# Patient Record
Sex: Female | Born: 1970 | Race: White | Hispanic: No | Smoking: Former smoker
Health system: Southern US, Community
[De-identification: ages and names within clinical notes are randomized; demographics above are authoritative.]

## PROBLEM LIST (undated history)

## (undated) DIAGNOSIS — F419 Anxiety disorder, unspecified: Secondary | ICD-10-CM

## (undated) DIAGNOSIS — Z9289 Personal history of other medical treatment: Secondary | ICD-10-CM

## (undated) DIAGNOSIS — B009 Herpesviral infection, unspecified: Secondary | ICD-10-CM

## (undated) DIAGNOSIS — G43829 Menstrual migraine, not intractable, without status migrainosus: Secondary | ICD-10-CM

## (undated) DIAGNOSIS — I471 Supraventricular tachycardia, unspecified: Secondary | ICD-10-CM

## (undated) DIAGNOSIS — N898 Other specified noninflammatory disorders of vagina: Secondary | ICD-10-CM

## (undated) DIAGNOSIS — F32A Depression, unspecified: Secondary | ICD-10-CM

## (undated) DIAGNOSIS — F329 Major depressive disorder, single episode, unspecified: Secondary | ICD-10-CM

## (undated) HISTORY — PX: KNEE SURGERY: SHX244

## (undated) HISTORY — DX: Personal history of other medical treatment: Z92.89

## (undated) HISTORY — DX: Depression, unspecified: F32.A

## (undated) HISTORY — DX: Major depressive disorder, single episode, unspecified: F32.9

## (undated) HISTORY — DX: Menstrual migraine, not intractable, without status migrainosus: G43.829

## (undated) HISTORY — DX: Herpesviral infection, unspecified: B00.9

## (undated) HISTORY — DX: Other specified noninflammatory disorders of vagina: N89.8

---

## 1997-11-13 ENCOUNTER — Other Ambulatory Visit: Admission: RE | Admit: 1997-11-13 | Discharge: 1997-11-13 | Payer: Self-pay | Admitting: Obstetrics & Gynecology

## 1997-11-18 ENCOUNTER — Other Ambulatory Visit: Admission: RE | Admit: 1997-11-18 | Discharge: 1997-11-18 | Payer: Self-pay | Admitting: Obstetrics & Gynecology

## 1997-11-22 ENCOUNTER — Inpatient Hospital Stay (HOSPITAL_COMMUNITY): Admission: AD | Admit: 1997-11-22 | Discharge: 1997-11-22 | Payer: Self-pay | Admitting: Obstetrics & Gynecology

## 1997-11-24 ENCOUNTER — Inpatient Hospital Stay (HOSPITAL_COMMUNITY): Admission: AD | Admit: 1997-11-24 | Discharge: 1997-11-26 | Payer: Self-pay | Admitting: Obstetrics and Gynecology

## 1998-03-01 ENCOUNTER — Other Ambulatory Visit: Admission: RE | Admit: 1998-03-01 | Discharge: 1998-03-01 | Payer: Self-pay | Admitting: Obstetrics & Gynecology

## 1998-04-13 ENCOUNTER — Ambulatory Visit (HOSPITAL_COMMUNITY): Admission: RE | Admit: 1998-04-13 | Discharge: 1998-04-13 | Payer: Self-pay | Admitting: *Deleted

## 1998-10-21 ENCOUNTER — Other Ambulatory Visit: Admission: RE | Admit: 1998-10-21 | Discharge: 1998-10-21 | Payer: Self-pay | Admitting: Obstetrics & Gynecology

## 1999-05-20 ENCOUNTER — Inpatient Hospital Stay (HOSPITAL_COMMUNITY): Admission: AD | Admit: 1999-05-20 | Discharge: 1999-05-22 | Payer: Self-pay | Admitting: Obstetrics and Gynecology

## 2000-04-05 ENCOUNTER — Other Ambulatory Visit: Admission: RE | Admit: 2000-04-05 | Discharge: 2000-04-05 | Payer: Self-pay | Admitting: Obstetrics and Gynecology

## 2000-11-08 ENCOUNTER — Inpatient Hospital Stay (HOSPITAL_COMMUNITY): Admission: AD | Admit: 2000-11-08 | Discharge: 2000-11-10 | Payer: Self-pay | Admitting: Obstetrics and Gynecology

## 2001-04-18 ENCOUNTER — Other Ambulatory Visit: Admission: RE | Admit: 2001-04-18 | Discharge: 2001-04-18 | Payer: Self-pay | Admitting: Obstetrics and Gynecology

## 2003-02-24 ENCOUNTER — Other Ambulatory Visit: Admission: RE | Admit: 2003-02-24 | Discharge: 2003-02-24 | Payer: Self-pay | Admitting: Obstetrics and Gynecology

## 2004-05-10 ENCOUNTER — Encounter: Admission: RE | Admit: 2004-05-10 | Discharge: 2004-05-10 | Payer: Self-pay | Admitting: Obstetrics and Gynecology

## 2004-06-08 ENCOUNTER — Other Ambulatory Visit: Admission: RE | Admit: 2004-06-08 | Discharge: 2004-06-08 | Payer: Self-pay | Admitting: Obstetrics and Gynecology

## 2005-12-13 ENCOUNTER — Encounter: Payer: Self-pay | Admitting: *Deleted

## 2010-09-21 ENCOUNTER — Ambulatory Visit: Payer: Self-pay | Admitting: General Practice

## 2010-10-17 ENCOUNTER — Encounter: Payer: Self-pay | Admitting: Sports Medicine

## 2010-11-13 ENCOUNTER — Encounter: Payer: Self-pay | Admitting: Sports Medicine

## 2012-06-25 LAB — HM MAMMOGRAPHY

## 2013-07-14 DIAGNOSIS — Z9289 Personal history of other medical treatment: Secondary | ICD-10-CM

## 2013-07-14 HISTORY — DX: Personal history of other medical treatment: Z92.89

## 2014-03-22 ENCOUNTER — Emergency Department: Payer: Self-pay | Admitting: Emergency Medicine

## 2014-03-24 ENCOUNTER — Encounter: Payer: Self-pay | Admitting: General Practice

## 2014-04-14 ENCOUNTER — Encounter: Payer: Self-pay | Admitting: General Practice

## 2015-01-13 DIAGNOSIS — N898 Other specified noninflammatory disorders of vagina: Secondary | ICD-10-CM

## 2015-01-13 DIAGNOSIS — B009 Herpesviral infection, unspecified: Secondary | ICD-10-CM

## 2015-01-13 HISTORY — DX: Other specified noninflammatory disorders of vagina: N89.8

## 2015-01-13 HISTORY — DX: Herpesviral infection, unspecified: B00.9

## 2015-08-02 LAB — HM PAP SMEAR: HM PAP: NEGATIVE

## 2015-09-20 DIAGNOSIS — F329 Major depressive disorder, single episode, unspecified: Secondary | ICD-10-CM | POA: Diagnosis not present

## 2015-09-20 DIAGNOSIS — N938 Other specified abnormal uterine and vaginal bleeding: Secondary | ICD-10-CM | POA: Diagnosis not present

## 2016-01-07 DIAGNOSIS — H524 Presbyopia: Secondary | ICD-10-CM | POA: Diagnosis not present

## 2016-02-10 ENCOUNTER — Emergency Department
Admission: EM | Admit: 2016-02-10 | Discharge: 2016-02-10 | Disposition: A | Discharge status: Home / Self Care | Payer: 59 | Attending: Emergency Medicine | Admitting: Emergency Medicine

## 2016-02-10 DIAGNOSIS — R000 Tachycardia, unspecified: Secondary | ICD-10-CM | POA: Diagnosis not present

## 2016-02-10 DIAGNOSIS — F419 Anxiety disorder, unspecified: Secondary | ICD-10-CM | POA: Insufficient documentation

## 2016-02-10 DIAGNOSIS — Z79899 Other long term (current) drug therapy: Secondary | ICD-10-CM | POA: Diagnosis not present

## 2016-02-10 HISTORY — DX: Anxiety disorder, unspecified: F41.9

## 2016-02-10 LAB — CBC
HEMATOCRIT: 42.3 % (ref 35.0–47.0)
HEMOGLOBIN: 15 g/dL (ref 12.0–16.0)
MCH: 32.4 pg (ref 26.0–34.0)
MCHC: 35.6 g/dL (ref 32.0–36.0)
MCV: 91.2 fL (ref 80.0–100.0)
PLATELETS: 245 10*3/uL (ref 150–440)
RBC: 4.64 MIL/uL (ref 3.80–5.20)
RDW: 12.6 % (ref 11.5–14.5)
WBC: 10.9 10*3/uL (ref 3.6–11.0)

## 2016-02-10 LAB — BASIC METABOLIC PANEL
ANION GAP: 13 (ref 5–15)
BUN: 13 mg/dL (ref 6–20)
CHLORIDE: 100 mmol/L — AB (ref 101–111)
CO2: 24 mmol/L (ref 22–32)
Calcium: 9.3 mg/dL (ref 8.9–10.3)
Creatinine, Ser: 1.05 mg/dL — ABNORMAL HIGH (ref 0.44–1.00)
GFR calc non Af Amer: >60 mL/min (ref >60)
Glucose, Bld: 123 mg/dL — ABNORMAL HIGH (ref 65–99)
POTASSIUM: 4 mmol/L (ref 3.5–5.1)
SODIUM: 137 mmol/L (ref 135–145)

## 2016-02-10 LAB — TROPONIN I: Troponin I: <0.03 ng/mL (ref <0.03)

## 2016-02-10 MED ORDER — SODIUM CHLORIDE 0.9 % IV BOLUS (SEPSIS)
1000.0000 mL | Freq: Once | INTRAVENOUS | Status: AC
  Administered 2016-02-10: 1000 mL via INTRAVENOUS

## 2016-02-10 MED ORDER — LORAZEPAM 2 MG/ML IJ SOLN
INTRAMUSCULAR | Status: AC
  Administered 2016-02-10: 1 mg via INTRAVENOUS
  Filled 2016-02-10: qty 1

## 2016-02-10 MED ORDER — LORAZEPAM 2 MG/ML IJ SOLN
1.0000 mg | Freq: Once | INTRAMUSCULAR | Status: AC
  Administered 2016-02-10: 1 mg via INTRAVENOUS

## 2016-02-10 NOTE — ED Notes (Signed)
Pt at work and began to feel diaphoretic. Pt placed on monitor at HR reading 150. Pt placed on NRB. MD at bedside. Pt denies pain. Pt attempting to calm herself by taking deep breaths. Alert and oriented X4.

## 2016-02-10 NOTE — ED Notes (Signed)
Patient is resting comfortably. 

## 2016-02-10 NOTE — ED Notes (Signed)
Pt denies taking anything PTA. States that she took Excedrin for headache PTA to work. Pt states that she has been experiencing a lot of stress. Pt taken off of oxygen at this time. Pt able to talk in full and complete sentences.

## 2016-02-10 NOTE — ED Provider Notes (Signed)
[facility] Emergency Department Provider Note  Time seen: 3:53 PM  I have reviewed the triage vital signs and the nursing notes.   HISTORY  Chief Complaint Tachycardia    HPI Megan SayreJanet L Hernandez is a 45 y.o. female with no past medical history who presents the emergency department with shaking and diaphoresis. Patient works at the emergency department, she states she just transported to the patient felt lightheaded and became diaphoretic. She checked her pulse with a pulse oximeter, had a pulse rate 150+. States she began having shaking in her arms or legs. She could not control. Patient denies any pain, denies any chest pain, trouble breathing. Patient states a similar episode years ago but she was using energy drinks at that time.She states she has not used any stimulants, does not use energy drinks. She does state she took an Excedrin for headache prior to arriving at work today. The patient states she feels numbness and tingling in both of her hands. Denies any weakness. Denies any headache currently.     Past Medical History  Diagnosis Date  . Anxiety     There are no active problems to display for this patient.   History reviewed. No pertinent past surgical history.  No current outpatient prescriptions on file.  Allergies Review of patient's allergies indicates no known allergies.  No family history on file.  Social History Social History  Substance Use Topics  . Smoking status: Never Smoker   . Smokeless tobacco: None  . Alcohol Use: No    Review of Systems Constitutional: Negative for fever. Cardiovascular: Negative for chest pain. Respiratory: Negative for shortness of breath. Gastrointestinal: Negative for abdominal pain Musculoskeletal: Negative for back pain. Neurological: Negative for headache 10-point ROS otherwise negative.  ____________________________________________   PHYSICAL EXAM:  VITAL SIGNS: ED Triage Vitals  Enc  Vitals Group     BP 02/10/16 1545 129/87 mmHg     Pulse Rate 02/10/16 1543 104     Resp 02/10/16 1543 28     Temp --      Temp src --      SpO2 02/10/16 1543 100 %     Weight --      Height --      Head Cir --      Peak Flow --      Pain Score --      Pain Loc --      Pain Edu? --      Excl. in GC? --     Constitutional: Alert and oriented. Tachypnea, hyperventilating, shaking in her extremities. Eyes: Normal exam, PERRL ENT   Head: Normocephalic and atraumatic.   Mouth/Throat: Mucous membranes are moist. Cardiovascular: Normal rate, regular rhythm. No murmur Respiratory: Normal respiratory effort without tachypnea nor retractions. Breath sounds are clear Gastrointestinal: Soft and nontender. No distention.   Musculoskeletal: Nontender extremities, shaking evident all extremities. Neurologic:  Normal speech and language. No gross focal neurologic deficits. Equal grip strengths. Skin:  Skin is warm, mild/slight diaphoresis. Psychiatric: Does appear anxious.  ____________________________________________    EKG  KG reviewed and interpreted by myself shows normal sinus rhythm at 101 bpm. Narrow QRS, normal axis, normal intervals and nonspecific ST changes. No skin elevations. Computer read as atrial fibrillation however she clearly has P waves before every QRS, electrical interference due to shaking.  ____________________________________________    INITIAL IMPRESSION / ASSESSMENT AND PLAN / ED COURSE  Pertinent labs & imaging results that were available during my care of  the patient were reviewed by me and considered in my medical decision making (see chart for details).  Patient presents to the emergency department with tachypnea, hyperventilation, diaphoresis, shaking in all extremities. Patient did take Excedrin migraine prior to arrival in the emergency department which does contain caffeine. Patient's heart rate initially elevated in the 120s, currently in the  90s. Patient received 1 mg of Ativan as well as IV fluids in the emergency department. After Ativan the patient is feeling much better, shaking has largely resolved, the heart rate has decreased, 81 currently. We will check basic labs and monitor closely in the emergency department.  Labs are within normal limits. Patient is feeling much better continues to have a normal heart rate around 75 bpm. Denies any symptoms at this time. Highly suspect an anxiety reaction. I discussed return precautions.  ____________________________________________   FINAL CLINICAL IMPRESSION(S) / ED DIAGNOSES  Tachycardia  Minna AntisKevin Shenique Childers, MD 02/10/16 [phone]

## 2016-02-10 NOTE — Discharge Instructions (Signed)
Nonspecific Tachycardia Tachycardia is a faster than normal heartbeat (more than 100 beats per minute). In adults, the heart normally beats between 60 and 100 times a minute. A fast heartbeat may be a normal response to exercise or stress. It does not necessarily mean that something is wrong. However, sometimes when your heart beats too fast it may not be able to pump enough blood to the rest of your body. This can result in chest pain, shortness of breath, dizziness, and even fainting. Nonspecific tachycardia means that the specific cause or pattern of your tachycardia is unknown. CAUSES  Tachycardia may be harmless or it may be due to a more serious underlying cause. Possible causes of tachycardia include:  Exercise or exertion.  Fever.  Pain or injury.  Infection.  Loss of body fluids (dehydration).  Overactive thyroid.  Lack of red blood cells (anemia).  Anxiety and stress.  Alcohol.  Caffeine.  Tobacco products.  Diet pills.  Illegal drugs.  Heart disease. SYMPTOMS  Rapid or irregular heartbeat (palpitations).  Suddenly feeling your heart beating (cardiac awareness).  Dizziness.  Tiredness (fatigue).  Shortness of breath.  Chest pain.  Nausea.  Fainting. DIAGNOSIS  Your caregiver will perform a physical exam and take your medical history. In some cases, a heart specialist (cardiologist) may be consulted. Your caregiver may also order:  Blood tests.  Electrocardiography. This test records the electrical activity of your heart.  A heart monitoring test. TREATMENT  Treatment will depend on the likely cause of your tachycardia. The goal is to treat the underlying cause of your tachycardia. Treatment methods may include:  Replacement of fluids or blood through an intravenous (IV) tube for moderate to severe dehydration or anemia.  New medicines or changes in your current medicines.  Diet and lifestyle changes.  Treatment for certain  infections.  Stress relief or relaxation methods. HOME CARE INSTRUCTIONS   Rest.  Drink enough fluids to keep your urine clear or pale yellow.  Do not smoke.  Avoid:  Caffeine.  Tobacco.  Alcohol.  Chocolate.  Stimulants such as over-the-counter diet pills or pills that help you stay awake.  Situations that cause anxiety or stress.  Illegal drugs such as marijuana, phencyclidine (PCP), and cocaine.  Only take medicine as directed by your caregiver.  Keep all follow-up appointments as directed by your caregiver. SEEK IMMEDIATE MEDICAL CARE IF:   You have pain in your chest, upper arms, jaw, or neck.  You become weak, dizzy, or feel faint.  You have palpitations that will not go away.  You vomit, have diarrhea, or pass blood in your stool.  Your skin is cool, pale, and wet.  You have a fever that will not go away with rest, fluids, and medicine. MAKE SURE YOU:   Understand these instructions.  Will watch your condition.  Will get help right away if you are not doing well or get worse.   This information is not intended to replace advice given to you by your health care provider. Make sure you discuss any questions you have with your health care provider.   Document Released: 09/07/2004 Document Revised: 10/23/2011 Document Reviewed: 02/12/2015 Elsevier Interactive Patient Education 2016 Elsevier Inc.  

## 2016-09-19 DIAGNOSIS — Z3041 Encounter for surveillance of contraceptive pills: Secondary | ICD-10-CM | POA: Diagnosis not present

## 2016-09-19 DIAGNOSIS — A6000 Herpesviral infection of urogenital system, unspecified: Secondary | ICD-10-CM | POA: Diagnosis not present

## 2016-09-19 DIAGNOSIS — Z01419 Encounter for gynecological examination (general) (routine) without abnormal findings: Secondary | ICD-10-CM | POA: Diagnosis not present

## 2016-09-19 DIAGNOSIS — Z0000 Encounter for general adult medical examination without abnormal findings: Secondary | ICD-10-CM | POA: Diagnosis not present

## 2016-09-19 DIAGNOSIS — Z1239 Encounter for other screening for malignant neoplasm of breast: Secondary | ICD-10-CM | POA: Diagnosis not present

## 2016-09-19 DIAGNOSIS — Z13220 Encounter for screening for lipoid disorders: Secondary | ICD-10-CM | POA: Diagnosis not present

## 2016-10-11 ENCOUNTER — Other Ambulatory Visit: Payer: Self-pay | Admitting: Advanced Practice Midwife

## 2016-10-11 ENCOUNTER — Encounter: Payer: Self-pay | Admitting: Advanced Practice Midwife

## 2016-10-11 DIAGNOSIS — N938 Other specified abnormal uterine and vaginal bleeding: Secondary | ICD-10-CM

## 2016-10-17 ENCOUNTER — Other Ambulatory Visit: Payer: Self-pay | Admitting: Obstetrics and Gynecology

## 2016-10-17 ENCOUNTER — Telehealth: Payer: Self-pay | Admitting: Obstetrics and Gynecology

## 2016-10-17 ENCOUNTER — Ambulatory Visit (INDEPENDENT_AMBULATORY_CARE_PROVIDER_SITE_OTHER): Payer: 59

## 2016-10-17 DIAGNOSIS — N938 Other specified abnormal uterine and vaginal bleeding: Secondary | ICD-10-CM | POA: Diagnosis not present

## 2016-10-17 MED ORDER — SUMATRIPTAN SUCCINATE 100 MG PO TABS: 100.0000 mg | ORAL_TABLET | Freq: Once | ORAL | 0 refills | Status: DC | PRN

## 2016-10-17 NOTE — Progress Notes (Signed)
tr

## 2016-10-17 NOTE — Telephone Encounter (Signed)
Pt aware of neg u/s for DUB sx on OCPs. Discussed changing OCPs again, IUD, ablation, nuvaring. Pt interested in Mirena. Will check ins coverage. Supposed to have menses this wk on OCPs. RTO 10/19/16 for IUD insertion. Pt can cancel appt if changes mind/no insurance co/not bleeding.  Will also RF imitrex Rx. Pt to follow sx. If migraines cont, will refer to neuro.

## 2016-10-17 NOTE — Telephone Encounter (Signed)
U/S results from 10/17/16: Em=4.7 mm; bilat ovaries with follicles; no FF in CDS. Pt aware.

## 2016-10-18 NOTE — Telephone Encounter (Signed)
Pt scheduled  

## 2016-10-19 ENCOUNTER — Ambulatory Visit: Payer: 59 | Admitting: Obstetrics and Gynecology

## 2016-10-19 NOTE — Telephone Encounter (Signed)
Mirena Stock reserved for this patient. 

## 2016-10-24 ENCOUNTER — Encounter: Payer: Self-pay | Admitting: Physician Assistant

## 2016-10-24 ENCOUNTER — Ambulatory Visit: Payer: Self-pay | Admitting: Physician Assistant

## 2016-10-24 VITALS — BP 100/70 | HR 99 | Temp 98.3°F

## 2016-10-24 DIAGNOSIS — G43109 Migraine with aura, not intractable, without status migrainosus: Secondary | ICD-10-CM

## 2016-10-24 MED ORDER — DOXYCYCLINE HYCLATE 100 MG PO TABS: 100.0000 mg | ORAL_TABLET | Freq: Two times a day (BID) | ORAL | 0 refills | Status: DC

## 2016-10-24 MED ORDER — RIZATRIPTAN BENZOATE 10 MG PO TABS: 10.0000 mg | ORAL_TABLET | ORAL | 6 refills | Status: DC | PRN

## 2016-10-24 MED ORDER — PREDNISONE 10 MG PO TABS: 30.0000 mg | ORAL_TABLET | Freq: Every day | ORAL | 0 refills | Status: DC

## 2016-10-24 NOTE — Progress Notes (Addendum)
S: c/o headaches for over a year, have been worsening in intensity over last few months, pain will start at eye with decreased vision and loss of peripheral vision, headache will go across her forehead, no v/d, will be sensitive to light, last headache lasted for 5 days even with imitrex on board, insurance won't pay but for 9 pills a month, no trauma, no loc, states she thinks her hormones and stress level are contributing factors  O:v itals wnl, nad, perrl eomi, lungs c t a, cv rrr, cn II-xii intact  A: migraines,   P: maxalt, refer to neurology

## 2016-10-25 NOTE — Addendum Note (Signed)
Addended by: Yvonne KendallBROWN, Zak Gondek D on: 10/25/2016 10:26 AM   Modules accepted: Orders

## 2016-10-26 NOTE — Progress Notes (Signed)
[facility] sent over  On 10/25/2016. Awaiting appt.

## 2016-11-09 ENCOUNTER — Telehealth: Payer: Self-pay | Admitting: Obstetrics and Gynecology

## 2016-11-09 MED ORDER — VELIVET 0.1/0.125/0.15 -0.025 MG PO TABS: 1.0000 | ORAL_TABLET | Freq: Every day | ORAL | 1 refills | Status: DC

## 2016-11-09 NOTE — Telephone Encounter (Signed)
Patient called and scheduled her IUD insertion on May 1st with Alicia.  She will need another pk of pills to last until then.  Please advise  [phone] [facility]

## 2016-11-09 NOTE — Telephone Encounter (Signed)
Done

## 2016-11-09 NOTE — Telephone Encounter (Signed)
Noted  

## 2016-11-28 ENCOUNTER — Encounter: Payer: Self-pay | Admitting: Neurology

## 2016-11-29 ENCOUNTER — Ambulatory Visit: Payer: 59 | Admitting: Neurology

## 2016-12-12 ENCOUNTER — Ambulatory Visit: Payer: 59 | Admitting: Obstetrics and Gynecology

## 2016-12-12 ENCOUNTER — Telehealth: Payer: Self-pay | Admitting: Obstetrics and Gynecology

## 2016-12-12 ENCOUNTER — Encounter: Payer: Self-pay | Admitting: Obstetrics and Gynecology

## 2016-12-12 NOTE — Telephone Encounter (Signed)
Pt cancelled IUD insertion for 5/1

## 2016-12-12 NOTE — Telephone Encounter (Signed)
Pt needs to call when menses starts and I'll work her in for insertion. Huntley Dec, pls notify pt of this. Thx.

## 2016-12-12 NOTE — Telephone Encounter (Signed)
Pt is calling to cancel appt due to her cycle not coming on. Pt states she has cancelled twice now and would like to be advise on how to schedule due to her cycle being very irregular.

## 2016-12-12 NOTE — Telephone Encounter (Signed)
Mirena stock reserved for this patient. 

## 2016-12-13 NOTE — Telephone Encounter (Signed)
Pt advised.

## 2016-12-13 NOTE — Telephone Encounter (Signed)
Per Helmut Muster to work in pt when cycle comes on.

## 2016-12-17 ENCOUNTER — Other Ambulatory Visit: Payer: Self-pay | Admitting: Obstetrics and Gynecology

## 2016-12-18 ENCOUNTER — Other Ambulatory Visit: Payer: Self-pay | Admitting: Obstetrics and Gynecology

## 2016-12-18 MED ORDER — SUMATRIPTAN SUCCINATE 100 MG PO TABS: 100.0000 mg | ORAL_TABLET | Freq: Once | ORAL | 3 refills | Status: DC | PRN

## 2017-01-09 ENCOUNTER — Encounter: Payer: Self-pay | Admitting: Obstetrics and Gynecology

## 2017-01-09 NOTE — Telephone Encounter (Signed)
Pt is schedule for Mirena insertion with Helmut Musterlicia Copland 01/11/17

## 2017-01-10 NOTE — Telephone Encounter (Signed)
This encounter was created in error - please disregard.

## 2017-01-10 NOTE — Telephone Encounter (Signed)
Noted. Mirena stock reserved for this patient. 

## 2017-01-10 NOTE — Telephone Encounter (Signed)
You 1 hour ago (12:27 PM)     Noted. Mirena stock reserved for this patient.      Documentation     Paschal, Sung AmabileSara E routed conversation to You 01/09/17 (12:37 PM)    Tresa MoorePaschal, Sara E 01/09/17 (12:37 PM)      Pt is schedule for Mirena insertion with Helmut Musterlicia Copland 01/11/17

## 2017-01-11 ENCOUNTER — Encounter: Payer: Self-pay | Admitting: Obstetrics and Gynecology

## 2017-01-11 ENCOUNTER — Ambulatory Visit (INDEPENDENT_AMBULATORY_CARE_PROVIDER_SITE_OTHER): Payer: 59 | Admitting: Obstetrics and Gynecology

## 2017-01-11 VITALS — BP 116/64 | HR 86 | Wt 146.0 lb

## 2017-01-11 DIAGNOSIS — Z30430 Encounter for insertion of intrauterine contraceptive device: Secondary | ICD-10-CM

## 2017-01-11 MED ORDER — LEVONORGESTREL 20 MCG/24HR IU IUD: 1.0000 | INTRAUTERINE_SYSTEM | Freq: Once | INTRAUTERINE | 0 refills | Status: AC

## 2017-01-11 NOTE — Patient Instructions (Signed)

## 2017-01-11 NOTE — Telephone Encounter (Signed)
Pt arrived for IUD placement

## 2017-01-11 NOTE — Progress Notes (Signed)
   Chief Complaint  Patient presents with  . Contraception    Mirena insert     IUD PROCEDURE NOTE:  Megan SayreJanet L Petrosky is a 46 y.o. [id number] here for Mirena  IUD insertion. She had BTB on several OCPs with neg labs/u/s 4/18. Pt [facility] and cycle control.    BP 116/64 (BP Location: Left Arm, Patient Position: Sitting, Cuff Size: Normal)   Pulse 86   Wt 146 lb (66.2 kg)   LMP 01/09/2017 (Exact Date)   BMI 24.30 kg/m   IUD Insertion Procedure Note Patient identified, informed consent performed, consent signed.   Discussed risks of irregular bleeding, cramping, infection, malpositioning or misplacement of the IUD outside the uterus which may require further procedure such as laparoscopy, risk of failure <1%. Time out was performed.    A bimanual exam showed the uterus to be anteverted.  Speculum placed in the vagina.  Cervix visualized.  Cleaned with Betadine x 2.  Grasped anteriorly with a single tooth tenaculum.  Uterus sounded to 7.0 cm.   IUD placed per manufacturer's recommendations.  Strings trimmed to 3 cm. Tenaculum was removed, good hemostasis noted.  Patient tolerated procedure well.   ASSESSMENT: IUD insertion Encounter for insertion of intrauterine contraceptive device (IUD) - IUD in place. RTO in 4 wks for IUD check. - Plan: levonorgestrel (MIRENA, 52 MG,) 20 MCG/24HR IUD   Plan:  Patient was given post-procedure instructions.  She was advised to have backup contraception for one week.   Call if you are having increasing pain, cramps or bleeding or if you have a fever greater than 100.4 degrees F., shaking chills, nausea or vomiting. Patient was also asked to check IUD strings periodically and follow up in 4 weeks for IUD check.  Return in about 4 weeks (around 02/08/2017) for IUD f/u.  Latrisha Coiro B. Candee Hoon, PA-C 01/11/2017 3:03 PM

## 2017-02-06 ENCOUNTER — Encounter: Payer: Self-pay | Admitting: Obstetrics and Gynecology

## 2017-02-07 ENCOUNTER — Ambulatory Visit: Payer: 59 | Admitting: Obstetrics and Gynecology

## 2017-05-23 DIAGNOSIS — H524 Presbyopia: Secondary | ICD-10-CM | POA: Diagnosis not present

## 2017-06-11 ENCOUNTER — Other Ambulatory Visit: Payer: Self-pay | Admitting: Obstetrics and Gynecology

## 2017-06-11 NOTE — Telephone Encounter (Signed)
Please advise 

## 2017-09-27 ENCOUNTER — Ambulatory Visit (INDEPENDENT_AMBULATORY_CARE_PROVIDER_SITE_OTHER): Payer: Self-pay | Admitting: Nurse Practitioner

## 2017-09-27 VITALS — BP 98/57 | HR 81 | Temp 98.7°F | Wt 140.0 lb

## 2017-09-27 DIAGNOSIS — G43909 Migraine, unspecified, not intractable, without status migrainosus: Secondary | ICD-10-CM

## 2017-09-27 MED ORDER — SUMATRIPTAN SUCCINATE 100 MG PO TABS: 100.0000 mg | ORAL_TABLET | ORAL | 0 refills | Status: DC | PRN

## 2017-09-27 MED ORDER — RIZATRIPTAN BENZOATE 10 MG PO TABS: 10.0000 mg | ORAL_TABLET | ORAL | 0 refills | Status: DC | PRN

## 2017-09-27 NOTE — Patient Instructions (Addendum)
Migraine Headache A migraine headache is an intense, throbbing pain on one side or both sides of the head. Migraines may also cause other symptoms, such as nausea, vomiting, and sensitivity to light and noise. What are the causes? Doing or taking certain things may also trigger migraines, such as:  Alcohol.  Smoking.  Medicines, such as: ? Medicine used to treat chest pain (nitroglycerine). ? Birth control pills. ? Estrogen pills. ? Certain blood pressure medicines.  Aged cheeses, chocolate, or caffeine.  Foods or drinks that contain nitrates, glutamate, aspartame, or tyramine.  Physical activity.  Other things that may trigger a migraine include:  Menstruation.  Pregnancy.  Hunger.  Stress, lack of sleep, too much sleep, or fatigue.  Weather changes.  What increases the risk? The following factors may make you more likely to experience migraine headaches:  Age. Risk increases with age.  Family history of migraine headaches.  Being Caucasian.  Depression and anxiety.  Obesity.  Being a woman.  Having a hole in the heart (patent foramen ovale) or other heart problems.  What are the signs or symptoms? The main symptom of this condition is pulsating or throbbing pain. Pain may:  Happen in any area of the head, such as on one side or both sides.  Interfere with daily activities.  Get worse with physical activity.  Get worse with exposure to bright lights or loud noises.  Other symptoms may include:  Nausea.  Vomiting.  Dizziness.  General sensitivity to bright lights, loud noises, or smells.  Before you get a migraine, you may get warning signs that a migraine is developing (aura). An aura may include:  Seeing flashing lights or having blind spots.  Seeing bright spots, halos, or zigzag lines.  Having tunnel vision or blurred vision.  Having numbness or a tingling feeling.  Having trouble talking.  Having muscle weakness.  How is this  diagnosed? A migraine headache can be diagnosed based on:  Your symptoms.  A physical exam.  Tests, such as CT scan or MRI of the head. These imaging tests can help rule out other causes of headaches.  Taking fluid from the spine (lumbar puncture) and analyzing it (cerebrospinal fluid analysis, or CSF analysis).  How is this treated? A migraine headache is usually treated with medicines that:  Relieve pain.  Relieve nausea.  Prevent migraines from coming back.  Treatment may also include:  Acupuncture.  Lifestyle changes like avoiding foods that trigger migraines.  Follow these instructions at home: Medicines  Take over-the-counter and prescription medicines only as told by your health care provider.  Do not drive or use heavy machinery while taking prescription pain medicine.  To prevent or treat constipation while you are taking prescription pain medicine, your health care provider may recommend that you: ? Drink enough fluid to keep your urine clear or pale yellow. ? Take over-the-counter or prescription medicines. ? Eat foods that are high in fiber, such as fresh fruits and vegetables, whole grains, and beans. ? Limit foods that are high in fat and processed sugars, such as fried and sweet foods. Lifestyle  Avoid alcohol use.  Do not use any products that contain nicotine or tobacco, such as cigarettes and e-cigarettes. If you need help quitting, ask your health care provider.  Get at least 8 hours of sleep every night.  Limit your stress. General instructions   Keep a journal to find out what may trigger your migraine headaches. For example, write down: ? What you eat and   drink. ? How much sleep you get. ? Any change to your diet or medicines.  If you have a migraine: ? Avoid things that make your symptoms worse, such as bright lights. ? It may help to lie down in a dark, quiet room. ? Do not drive or use heavy machinery. ? Ask your health care provider  what activities are safe for you while you are experiencing symptoms.  Keep all follow-up visits as told by your health care provider. This is important. Contact a health care provider if:  You develop symptoms that are different or more severe than your usual migraine symptoms. Get help right away if:  Your migraine becomes severe.  You have a fever.  You have a stiff neck.  You have vision loss.  Your muscles feel weak or like you cannot control them.  You start to lose your balance often.  You develop trouble walking.  You faint. This information is not intended to replace advice given to you by your health care provider. Make sure you discuss any questions you have with your health care provider. Document Released: 07/31/2005 Document Revised: 02/18/2016 Document Reviewed: 01/17/2016 Elsevier Interactive Patient Education  2017 Elsevier Inc.   

## 2017-09-27 NOTE — Progress Notes (Signed)
Subjective:    Megan SayreJanet L Hernandez is a 47 y.o. female who presents for evaluation of headache. Symptoms began about a few hours ago. Generally, the headaches last about several hours and occur at rest. The headaches do not seem to be related to any time of the day. The headaches are usually moderate and are located in frontal region.  The patient rates her most severe headaches a 7 on a scale from 1 to 10. Recently, the headaches have been stable. Work attendance or other daily activities are affected by the headaches. Precipitating factors include: none which have been determined. The headaches are usually not preceded by an aura. Associated neurologic symptoms: nausea, photophobia, sensitivity to light. The patient denies dizziness, loss of balance, muscle weakness, numbness of extremities, speech difficulties and vision problems. Home treatment has included nothing with no improvement. Other history includes: migraine headaches diagnosed in the past. Family history includes no known family members with significant headaches.  The following portions of the patient's history were reviewed and updated as appropriate: allergies, current medications and past medical history.  Review of Systems Constitutional: positive for fatigue, negative for anorexia, chills, fevers and malaise Eyes: negative Ears, nose, mouth, throat, and face: negative Respiratory: negative Cardiovascular: negative Gastrointestinal: positive for nausea, negative for abdominal pain, constipation, diarrhea and vomiting Neurological: positive for headaches, negative for coordination problems, dizziness, memory problems, paresthesia, vertigo and weakness    Objective:    BP (!) 98/57   Pulse 81   Temp 98.7 F (37.1 C)   Wt 140 lb (63.5 kg)   SpO2 98%   BMI 23.30 kg/m  General appearance: alert, cooperative, fatigued, no distress and due to HA pain Head: Normocephalic, without obvious abnormality, atraumatic Eyes:  conjunctivae/corneas clear. PERRL, EOM's intact. Fundi benign. Ears: normal TM's and external ear canals both ears Nose: Nares normal. Septum midline. Mucosa normal. No drainage or sinus tenderness. Throat: lips, mucosa, and tongue normal; teeth and gums normal Lungs: clear to auscultation bilaterally Heart: regular rate and rhythm, S1, S2 normal, no murmur, click, rub or gallop Abdomen: soft, non-tender; bowel sounds normal; no masses,  no organomegaly Pulses: 2+ and symmetric Skin: Skin color, texture, turgor normal. No rashes or lesions Neurologic: Grossly normal    Assessment:    Classic migraine    Plan:    Lie in darkened room and apply cold packs as needed for pain. Prophylactic therapy: triptan therapy and and Maxalt due to high frequency of pain. Side effect profile discussed in detail. Asked to keep headache diary. Avoid alcohol, aspirin and OTC NSAIDs, caffeine, excessive physical activity, smoking and stressful situations as much as possible. Patient reassured that neurodiagnostic workup not indicated from benign H&P.

## 2017-10-19 ENCOUNTER — Other Ambulatory Visit: Payer: Self-pay | Admitting: Obstetrics and Gynecology

## 2017-11-21 ENCOUNTER — Other Ambulatory Visit: Payer: Self-pay | Admitting: Nurse Practitioner

## 2017-11-21 ENCOUNTER — Encounter: Payer: Self-pay | Admitting: Obstetrics and Gynecology

## 2017-11-21 ENCOUNTER — Other Ambulatory Visit: Payer: Self-pay | Admitting: Obstetrics and Gynecology

## 2017-11-21 MED ORDER — SERTRALINE HCL 100 MG PO TABS: 100.0000 mg | ORAL_TABLET | Freq: Every day | ORAL | 0 refills | Status: DC

## 2017-11-21 NOTE — Progress Notes (Signed)
Rx RF till 6/19 annual.

## 2018-01-06 ENCOUNTER — Emergency Department
Admission: EM | Admit: 2018-01-06 | Discharge: 2018-01-06 | Disposition: A | Discharge status: Home / Self Care | Payer: 59 | Attending: Emergency Medicine | Admitting: Emergency Medicine

## 2018-01-06 ENCOUNTER — Encounter: Payer: Self-pay | Admitting: Emergency Medicine

## 2018-01-06 ENCOUNTER — Emergency Department: Payer: 59

## 2018-01-06 DIAGNOSIS — R000 Tachycardia, unspecified: Secondary | ICD-10-CM | POA: Diagnosis not present

## 2018-01-06 DIAGNOSIS — Z79899 Other long term (current) drug therapy: Secondary | ICD-10-CM | POA: Diagnosis not present

## 2018-01-06 DIAGNOSIS — R079 Chest pain, unspecified: Secondary | ICD-10-CM | POA: Diagnosis not present

## 2018-01-06 DIAGNOSIS — R0602 Shortness of breath: Secondary | ICD-10-CM | POA: Insufficient documentation

## 2018-01-06 DIAGNOSIS — R52 Pain, unspecified: Secondary | ICD-10-CM

## 2018-01-06 DIAGNOSIS — G43009 Migraine without aura, not intractable, without status migrainosus: Secondary | ICD-10-CM | POA: Insufficient documentation

## 2018-01-06 HISTORY — DX: Supraventricular tachycardia: I47.1

## 2018-01-06 HISTORY — DX: Supraventricular tachycardia, unspecified: I47.10

## 2018-01-06 LAB — BASIC METABOLIC PANEL
ANION GAP: 14 (ref 5–15)
BUN: 21 mg/dL — ABNORMAL HIGH (ref 6–20)
CHLORIDE: 104 mmol/L (ref 101–111)
CO2: 19 mmol/L — ABNORMAL LOW (ref 22–32)
Calcium: 8.9 mg/dL (ref 8.9–10.3)
Creatinine, Ser: 0.93 mg/dL (ref 0.44–1.00)
GFR calc Af Amer: >60 mL/min (ref >60)
GFR calc non Af Amer: >60 mL/min (ref >60)
Glucose, Bld: 113 mg/dL — ABNORMAL HIGH (ref 65–99)
POTASSIUM: 3.7 mmol/L (ref 3.5–5.1)
Sodium: 137 mmol/L (ref 135–145)

## 2018-01-06 LAB — CBC
HEMATOCRIT: 41.3 % (ref 35.0–47.0)
HEMOGLOBIN: 14.8 g/dL (ref 12.0–16.0)
MCH: 32.5 pg (ref 26.0–34.0)
MCHC: 35.7 g/dL (ref 32.0–36.0)
MCV: 91.1 fL (ref 80.0–100.0)
Platelets: 296 10*3/uL (ref 150–440)
RBC: 4.53 MIL/uL (ref 3.80–5.20)
RDW: 13 % (ref 11.5–14.5)
WBC: 10.5 10*3/uL (ref 3.6–11.0)

## 2018-01-06 LAB — TROPONIN I: Troponin I: <0.03 ng/mL (ref <0.03)

## 2018-01-06 LAB — FIBRIN DERIVATIVES D-DIMER (ARMC ONLY): FIBRIN DERIVATIVES D-DIMER (ARMC): 235.07 ng{FEU}/mL (ref 0.00–499.00)

## 2018-01-06 MED ORDER — ADENOSINE 12 MG/4ML IV SOLN
INTRAVENOUS | Status: AC
  Filled 2018-01-06: qty 4

## 2018-01-06 MED ORDER — ADENOSINE 6 MG/2ML IV SOLN
INTRAVENOUS | Status: AC
  Filled 2018-01-06: qty 2

## 2018-01-06 MED ORDER — LORAZEPAM 2 MG/ML IJ SOLN
INTRAMUSCULAR | Status: AC
  Administered 2018-01-06: 0.5 mg via INTRAVENOUS
  Filled 2018-01-06: qty 1

## 2018-01-06 MED ORDER — PROCHLORPERAZINE EDISYLATE 10 MG/2ML IJ SOLN
10.0000 mg | Freq: Once | INTRAMUSCULAR | Status: AC
  Administered 2018-01-06: 10 mg via INTRAVENOUS
  Filled 2018-01-06: qty 2

## 2018-01-06 MED ORDER — LORAZEPAM 2 MG/ML IJ SOLN
0.5000 mg | Freq: Once | INTRAMUSCULAR | Status: AC
  Administered 2018-01-06: 0.5 mg via INTRAVENOUS

## 2018-01-06 MED ORDER — SODIUM CHLORIDE 0.9 % IV BOLUS
1000.0000 mL | Freq: Once | INTRAVENOUS | Status: AC
  Administered 2018-01-06: 1000 mL via INTRAVENOUS

## 2018-01-06 MED ORDER — LORAZEPAM 2 MG/ML IJ SOLN
INTRAMUSCULAR | Status: AC
  Filled 2018-01-06: qty 1

## 2018-01-06 NOTE — ED Triage Notes (Signed)
Patient c/o muscle cramps and tachycardia. Patient's radial pulse in the 140-150 range. Patient reports hx of SVT.

## 2018-01-06 NOTE — ED Notes (Signed)
Pt converted with vagal maneuver. Adenosine returned to pyxis. Ns started.

## 2018-01-06 NOTE — Discharge Instructions (Signed)
Please return for any further problems. I would not work today I have given you enough medicine that you will not be effective. Please follow-up with your regular doctor later on this coming week.

## 2018-01-06 NOTE — ED Provider Notes (Signed)
[facility] Emergency Department Provider Note   ____________________________________________   First MD Initiated Contact with Patient 01/06/18 1916     (approximate)  I have reviewed the triage vital signs and the nursing notes.   HISTORY  Chief Complaint Tachycardia    HPI Megan Hernandez is a 47 y.o. female She reports she was coming to work and got a funny feeling and became short of breath and tachycardic once. On arrival in the emergency room and heart rate was 147. Patient reported history of SVT. She denies any caffeine use except for one cup of coffee earlier this morning. She does not use any supplements does not drink any alcohol.she had one prior visit in June 2017 with tachycardia which was felt to be due to anxiety. Here when I saw the patient heart rate did drop to 123 at that time and there are P waves before each QRS. With a little bit of coaching her heart rate dropped into the 100s and then into the 90s. The time that I saw her she was in sinus tachycardia.   Past Medical History:  Diagnosis Date  . Anxiety   . Depression   . 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 History of mammogram 11/12/213   birads 2 - benign  . History of Papanicolaou smear of cervix 07/14/2013   -/-  . SVT (supraventricular tachycardia) (HCC)   . Vaginal lesion 02/01/2015    There are no active problems to display for this patient.   Past Surgical History:  Procedure Laterality Date  . KNEE SURGERY  as a teen   As a teen    Prior to Admission medications   Medication Sig Start Date End Date Taking? Authorizing Provider  levonorgestrel (MIRENA, 52 MG,) 20 MCG/24HR IUD 1 Intra Uterine Device (1 each total) by Intrauterine route once. 01/11/17 01/11/17  Copland, Ilona Sorrel, PA-C  rizatriptan (MAXALT) 10 MG tablet Take 1 tablet (10 mg total) by mouth as needed for migraine. May repeat in 2 hours if needed Patient not taking: Reported on 09/27/2017 10/24/16   Faythe Ghee, PA-C  rizatriptan (MAXALT) 10 MG tablet Take 1 tablet (10 mg total) by mouth as needed for up to 2 days for migraine. May repeat in 2 hours if needed 09/27/17 09/29/17  Benay Pike, NP  sertraline (ZOLOFT) 100 MG tablet Take 1 tablet (100 mg total) by mouth daily. 11/21/17   Copland, Ilona Sorrel, PA-C  SUMAtriptan (IMITREX) 100 MG tablet Take 1 tablet (100 mg total) by mouth once as needed for migraine. May repeat in 2 hours if headache persists or recurs. Patient not taking: Reported on 09/27/2017 12/18/16   Vena Austria, MD  SUMAtriptan (IMITREX) 100 MG tablet Take 1 tablet (100 mg total) by mouth every 2 (two) hours as needed for up to 2 days for migraine. May repeat in 2 hours if headache persists or recurs. 09/27/17 09/29/17  Benay Pike, NP  valACYclovir (VALTREX) 500 MG tablet  10/23/16   [provider]    Allergies Patient has no known allergies.  Family History  Problem Relation Age of Onset  . Osteoporosis Mother   . Diabetes Father        Type 2  . Hypertension Father   . Depression Father   . Chronic bronchitis Father        copd  . Bipolar disorder Father     Social History Social History   Tobacco  Use  . Smoking status: Never Smoker  . Smokeless tobacco: Never Used  Substance Use Topics  . Alcohol use: Yes    Comment: occ  . Drug use: No    Review of Systems  Constitutional: No fever/chills Eyes: No visual changes. ENT: No sore throat. Cardiovascular: Denies chest pain. Respiratory: Denies shortness of breath. Gastrointestinal: No abdominal pain.  No nausea, no vomiting.  No diarrhea.  No constipation. Genitourinary: Negative for dysuria. Musculoskeletal: Negative for back pain. Skin: Negative for rash. Neurological: Negative for headaches, focal weakness or  ____________________________________________   PHYSICAL EXAM:  VITAL SIGNS: ED Triage Vitals  Enc Vitals Group     BP 01/06/18 1913 117/82     Pulse Rate  01/06/18 1910 (!) 147     Resp 01/06/18 1910 (!) 25     Temp 01/06/18 1913 98.9 F (37.2 C)     Temp Source 01/06/18 1913 Oral     SpO2 01/06/18 1910 100 %     Weight 01/06/18 1912 140 lb (63.5 kg)     Height --      Head Circumference --      Peak Flow --      Pain Score 01/06/18 1911 0     Pain Loc --      Pain Edu? --      Excl. in GC? --     Constitutional: Alert and oriented. Well appearing and in no acute distress. Eyes: Conjunctivae are normal.  Head: Atraumatic. Nose: No congestion/rhinnorhea. Mouth/Throat: Mucous membranes are moist.  Oropharynx non-erythematous. Neck: No stridor Cardiovascular: rapid rate, regular rhythm. Grossly normal heart sounds.  Good peripheral circulation. Respiratory: Normal respiratory effort.  No retractions. Lungs CTAB. Gastrointestinal: Soft and nontender. No distention. No abdominal bruits. No CVA tenderness. Musculoskeletal: No lower extremity tenderness nor edema.   Neurologic:  Normal speech and language. No gross focal neurologic deficits are appreciated. . Skin:  Skin is warm, dry and intact. No rash noted. Psychiatric: Mood and affect are normal. Speech and behavior are normal.  ____________________________________________   LABS (all labs ordered are listed, but only abnormal results are displayed)  Labs Reviewed  BASIC METABOLIC PANEL - Abnormal; Notable for the following components:      Result Value   CO2 19 (*)    Glucose, Bld 113 (*)    BUN 21 (*)    All other components within normal limits  CBC  TROPONIN I  FIBRIN DERIVATIVES D-DIMER ([facility] ONLY)  TROPONIN I   ____________________________________________  EKG  EKG shows sinus tachycardia at a rate of 148 normal axis no acute ST-T changes ____________________________________________  RADIOLOGY  ED MD interpretation: chest x-ray shows no acute disease I reviewed the film  Official radiology report(s): Dg Chest 1 View  Result Date: 01/06/2018 CLINICAL DATA:   Pain EXAM: CHEST  1 VIEW COMPARISON:  None. FINDINGS: The heart size and mediastinal contours are within normal limits. Both lungs are clear. The visualized skeletal structures are unremarkable. IMPRESSION: No active disease. Electronically Signed   By: Elige Ko   On: 01/06/2018 20:05    ____________________________________________   PROCEDURES  Procedure(s) performed:   Procedures  Critical Care performed:   ____________________________________________   INITIAL IMPRESSION / ASSESSMENT AND PLAN / ED COURSE  patient doing much better now she had one other episode of sinus tach that improved with a half milligram of Ativan. Then her migraine flared up a little but not seems to resolve with Compazine. I will let her go  she will follow-up with her regular doctor return if she has any further problems she does not need to work today.         ____________________________________________   FINAL CLINICAL IMPRESSION(S) / ED DIAGNOSES  Final diagnoses:  Tachycardia  Migraine without aura and without status migrainosus, not intractable     ED Discharge Orders    None       Note:  This document was prepared using Dragon voice recognition software and may include unintentional dictation errors.    Arnaldo Natal, MD 01/06/18 2213

## 2018-04-30 ENCOUNTER — Encounter: Payer: Self-pay | Admitting: Nurse Practitioner

## 2018-05-01 ENCOUNTER — Telehealth: Payer: Self-pay | Admitting: Obstetrics and Gynecology

## 2018-05-01 ENCOUNTER — Other Ambulatory Visit: Payer: Self-pay | Admitting: Obstetrics and Gynecology

## 2018-05-01 ENCOUNTER — Encounter: Payer: Self-pay | Admitting: Obstetrics and Gynecology

## 2018-05-01 MED ORDER — SERTRALINE HCL 100 MG PO TABS: 100.0000 mg | ORAL_TABLET | Freq: Every day | ORAL | 0 refills | Status: DC

## 2018-05-01 NOTE — Telephone Encounter (Signed)
Patient is schedule for annual with [facility]. Requesting medication refill on Zoloft for get her to schedule appointment 08/05/18.

## 2018-05-01 NOTE — Telephone Encounter (Signed)
Please advise 

## 2018-05-01 NOTE — Progress Notes (Signed)
Rx RF till 12/19 annual

## 2018-08-05 ENCOUNTER — Encounter: Payer: Self-pay | Admitting: Obstetrics and Gynecology

## 2018-08-05 ENCOUNTER — Ambulatory Visit (INDEPENDENT_AMBULATORY_CARE_PROVIDER_SITE_OTHER): Payer: 59 | Admitting: Obstetrics and Gynecology

## 2018-08-05 ENCOUNTER — Other Ambulatory Visit (HOSPITAL_COMMUNITY)
Admission: RE | Admit: 2018-08-05 | Discharge: 2018-08-05 | Disposition: A | Discharge status: Home / Self Care | Payer: 59 | Source: Ambulatory Visit | Attending: Obstetrics and Gynecology | Admitting: Obstetrics and Gynecology

## 2018-08-05 VITALS — BP 100/64 | HR 80 | Ht 65.0 in | Wt 153.0 lb

## 2018-08-05 DIAGNOSIS — Z1151 Encounter for screening for human papillomavirus (HPV): Secondary | ICD-10-CM

## 2018-08-05 DIAGNOSIS — F329 Major depressive disorder, single episode, unspecified: Secondary | ICD-10-CM

## 2018-08-05 DIAGNOSIS — Z0000 Encounter for general adult medical examination without abnormal findings: Secondary | ICD-10-CM

## 2018-08-05 DIAGNOSIS — F32A Depression, unspecified: Secondary | ICD-10-CM

## 2018-08-05 DIAGNOSIS — A6004 Herpesviral vulvovaginitis: Secondary | ICD-10-CM

## 2018-08-05 DIAGNOSIS — Z13220 Encounter for screening for lipoid disorders: Secondary | ICD-10-CM

## 2018-08-05 DIAGNOSIS — G43839 Menstrual migraine, intractable, without status migrainosus: Secondary | ICD-10-CM

## 2018-08-05 DIAGNOSIS — Z1239 Encounter for other screening for malignant neoplasm of breast: Secondary | ICD-10-CM

## 2018-08-05 DIAGNOSIS — Z01419 Encounter for gynecological examination (general) (routine) without abnormal findings: Secondary | ICD-10-CM

## 2018-08-05 DIAGNOSIS — F419 Anxiety disorder, unspecified: Secondary | ICD-10-CM

## 2018-08-05 DIAGNOSIS — Z124 Encounter for screening for malignant neoplasm of cervix: Secondary | ICD-10-CM | POA: Insufficient documentation

## 2018-08-05 DIAGNOSIS — Z30431 Encounter for routine checking of intrauterine contraceptive device: Secondary | ICD-10-CM

## 2018-08-05 MED ORDER — SERTRALINE HCL 100 MG PO TABS: 100.0000 mg | ORAL_TABLET | Freq: Every day | ORAL | 3 refills | Status: DC

## 2018-08-05 NOTE — Progress Notes (Signed)
PCP:  Patient, No Pcp Per   Chief Complaint  Patient presents with  . Gynecologic Exam     HPI:      Ms. Megan Hernandez is a 47 y.o. [phone] who LMP was No LMP recorded (lmp unknown). (Menstrual status: IUD)., presents today for her annual examination.  Her menses are infrequent with IUD, light and spotting for a day. Dysmenorrhea none. She does not have intermenstrual bleeding. Much happier with IUD then OCPs for cycle control.   Sex activity: single partner, contraception - IUD. Mirena placed 01/11/17. Last Pap: August 02, 2015  Results were: no abnormalities /neg HPV DNA  Hx of STDs: HSV; takes valtrex prn now. Doesn't need Rx RF. No recent outbreaks.  Last mammogram: not recent; pt uninterested due to radiation exposure.  There is no FH of breast cancer. There is no FH of ovarian cancer. The patient does do self-breast exams.  Tobacco use: The patient denies current or previous tobacco use. Alcohol use: social drinker No drug use.  Exercise: moderately active  She does get adequate calcium but not Vitamin D in her diet.  Elevated lipids 2018. Pt takes zoloft 100 mg for anxiety/depression sx. Used to work better. Some days it doesn't feel like she is taking anything, but doesn't necessarily want to increase dose. Under a lot of stress, not exercising as much.  Takes imitrex prn migraines. Sx occur when pt should have period but she doesn't bleed usually with IUD. Can have headaches for 4-5 days, having to continue to take meds. Doesn't need RF currently. Doesn't have PCP, except for tele-med.    Past Medical History:  Diagnosis Date  . Anxiety   . Depression   . 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 History of mammogram 11/12/213   birads 2 - benign  . History of Papanicolaou smear of cervix 07/14/2013   -/-  . Menstrual migraine   . SVT (supraventricular tachycardia) (HCC)   . Vaginal lesion 02/01/2015    Past Surgical History:  Procedure Laterality Date  . KNEE  SURGERY  as a teen   As a teen    Family History  Problem Relation Age of Onset  . Osteoporosis Mother   . Diabetes Father        Type 2  . Hypertension Father   . Depression Father   . Chronic bronchitis Father        copd  . Bipolar disorder Father     Social History   Socioeconomic History  . Marital status: Divorced    Spouse name: Not on file  . Number of children: Not on file  . Years of education: Not on file  . Highest education level: Not on file  Occupational History  . Not on file  Social Needs  . Financial resource strain: Not on file  . Food insecurity:    Worry: Not on file    Inability: Not on file  . Transportation needs:    Medical: Not on file    Non-medical: Not on file  Tobacco Use  . Smoking status: Never Smoker  . Smokeless tobacco: Never Used  Substance and Sexual Activity  . Alcohol use: Yes    Comment: occ  . Drug use: No  . Sexual activity: Yes    Birth control/protection: I.U.D.    Comment: Mirena  Lifestyle  . Physical activity:    Days per week: Not on file    Minutes per session: Not  on file  . Stress: Not on file  Relationships  . Social connections:    Talks on phone: Not on file    Gets together: Not on file    Attends religious service: Not on file    Active member of club or organization: Not on file    Attends meetings of clubs or organizations: Not on file    Relationship status: Not on file  . Intimate partner violence:    Fear of current or ex partner: Not on file    Emotionally abused: Not on file    Physically abused: Not on file    Forced sexual activity: Not on file  Other Topics Concern  . Not on file  Social History Narrative  . Not on file    Outpatient Medications Prior to Visit  Medication Sig Dispense Refill  . valACYclovir (VALTREX) 500 MG tablet     . sertraline (ZOLOFT) 100 MG tablet Take 1 tablet (100 mg total) by mouth daily. 90 tablet 0  . levonorgestrel (MIRENA, 52 MG,) 20 MCG/24HR IUD 1  Intra Uterine Device (1 each total) by Intrauterine route once. 1 Intra Uterine Device 0  . rizatriptan (MAXALT) 10 MG tablet Take 1 tablet (10 mg total) by mouth as needed for up to 2 days for migraine. May repeat in 2 hours if needed 6 tablet 0  . SUMAtriptan (IMITREX) 100 MG tablet Take 1 tablet (100 mg total) by mouth every 2 (two) hours as needed for up to 2 days for migraine. May repeat in 2 hours if headache persists or recurs. 6 tablet 0  . rizatriptan (MAXALT) 10 MG tablet Take 1 tablet (10 mg total) by mouth as needed for migraine. May repeat in 2 hours if needed (Patient not taking: Reported on 09/27/2017) 10 tablet 6  . SUMAtriptan (IMITREX) 100 MG tablet Take 1 tablet (100 mg total) by mouth once as needed for migraine. May repeat in 2 hours if headache persists or recurs. (Patient not taking: Reported on 09/27/2017) 9 tablet 3   No facility-administered medications prior to visit.       ROS:  Review of Systems  Constitutional: Negative for fatigue, fever and unexpected weight change.  Respiratory: Negative for cough, shortness of breath and wheezing.   Cardiovascular: Negative for chest pain, palpitations and leg swelling.  Gastrointestinal: Negative for blood in stool, constipation, diarrhea, nausea and vomiting.  Endocrine: Negative for cold intolerance, heat intolerance and polyuria.  Genitourinary: Negative for dyspareunia, dysuria, flank pain, frequency, genital sores, hematuria, menstrual problem, pelvic pain, urgency, vaginal bleeding, vaginal discharge and vaginal pain.  Musculoskeletal: Negative for back pain, joint swelling and myalgias.  Skin: Negative for rash.  Neurological: Positive for headaches. Negative for dizziness, syncope, light-headedness and numbness.  Hematological: Negative for adenopathy.  Psychiatric/Behavioral: Positive for agitation and dysphoric mood. Negative for confusion, sleep disturbance and suicidal ideas. The patient is not nervous/anxious.     BREAST: No symptoms   Objective: BP 100/64   Pulse 80   Ht 5\' 5"  (1.651 m)   Wt 153 lb (69.4 kg)   LMP  (LMP Unknown)   BMI 25.46 kg/m    Physical Exam Constitutional:      Appearance: She is well-developed.  Genitourinary:     Vulva, vagina, cervix, uterus, right adnexa and left adnexa normal.     No vulval lesion or tenderness noted.     No vaginal discharge, erythema or tenderness.     No cervical polyp.  Uterus is not enlarged or tender.     No right or left adnexal mass present.     Right adnexa not tender.     Left adnexa not tender.  Neck:     Musculoskeletal: Normal range of motion.     Thyroid: No thyromegaly.  Cardiovascular:     Rate and Rhythm: Normal rate and regular rhythm.     Heart sounds: Normal heart sounds. No murmur.  Pulmonary:     Effort: Pulmonary effort is normal.     Breath sounds: Normal breath sounds.  Chest:     Breasts:        Right: No mass, nipple discharge, skin change or tenderness.        Left: No mass, nipple discharge, skin change or tenderness.  Abdominal:     Palpations: Abdomen is soft.     Tenderness: There is no abdominal tenderness. There is no guarding.  Musculoskeletal: Normal range of motion.  Neurological:     Mental Status: She is alert and oriented to person, place, and time.     Cranial Nerves: No cranial nerve deficit.  Psychiatric:        Behavior: Behavior normal.  Vitals signs reviewed.     Assessment/Plan: Encounter for annual routine gynecological examination  Cervical cancer screening - Plan: Cytology - PAP  Screening for HPV (human papillomavirus) - Plan: Cytology - PAP  Encounter for routine checking of intrauterine contraceptive device (IUD) - IUD strings in place.   Screening for breast cancer - Encouraged pt to sched mammo. Order placed.  - Plan: MM 3D SCREEN BREAST BILATERAL  Herpes simplex vulvovaginitis - Pt to call for Rx RF valtrex prn.  Blood tests for routine general physical  examination - Plan: Comprehensive metabolic panel, Lipid panel  Screening cholesterol level - Plan: Lipid panel  Intractable menstrual migraine without status migrainosus - Will call for Rx imitrex prn.   Anxiety and depression - Rx RF zoloft. May need to take 1 1/2 tabs (150 mg dose) if having difficult day. If does better, may need to change to 150 mg dose overall. F/u prn.  Meds ordered this encounter  Medications  . sertraline (ZOLOFT) 100 MG tablet    Sig: Take 1 tablet (100 mg total) by mouth daily.    Dispense:  90 tablet    Refill:  3    Order Specific Question:   Supervising Provider    Answer:   Nadara MustardHARRIS, ROBERT P [[id number]]             GYN counsel mammography screening, menopause, adequate intake of calcium and vitamin D, diet and exercise     F/U  Return in about 1 year (around 08/06/2019).  Seward Coran B. Azlee Monforte, PA-C 08/05/2018 5:02 PM

## 2018-08-12 LAB — CYTOLOGY - PAP
Diagnosis: NEGATIVE
HPV (WINDOPATH): NOT DETECTED

## 2018-08-14 ENCOUNTER — Telehealth: Payer: Self-pay | Admitting: Obstetrics and Gynecology

## 2018-08-15 NOTE — Telephone Encounter (Signed)
Done

## 2018-08-15 NOTE — Telephone Encounter (Signed)
Patient is returning missed call. Please advise 

## 2018-08-16 ENCOUNTER — Other Ambulatory Visit (HOSPITAL_COMMUNITY)
Admission: RE | Admit: 2018-08-16 | Discharge: 2018-08-16 | Disposition: A | Discharge status: Home / Self Care | Payer: 59 | Source: Ambulatory Visit | Attending: Obstetrics and Gynecology | Admitting: Obstetrics and Gynecology

## 2018-08-16 ENCOUNTER — Ambulatory Visit (INDEPENDENT_AMBULATORY_CARE_PROVIDER_SITE_OTHER): Payer: 59 | Admitting: Obstetrics and Gynecology

## 2018-08-16 ENCOUNTER — Encounter: Payer: Self-pay | Admitting: Obstetrics and Gynecology

## 2018-08-16 ENCOUNTER — Other Ambulatory Visit: Payer: 59

## 2018-08-16 VITALS — BP 106/70 | HR 81 | Ht 65.0 in | Wt 150.0 lb

## 2018-08-16 DIAGNOSIS — A5901 Trichomonal vulvovaginitis: Secondary | ICD-10-CM | POA: Diagnosis not present

## 2018-08-16 DIAGNOSIS — G43839 Menstrual migraine, intractable, without status migrainosus: Secondary | ICD-10-CM

## 2018-08-16 DIAGNOSIS — Z113 Encounter for screening for infections with a predominantly sexual mode of transmission: Secondary | ICD-10-CM

## 2018-08-16 MED ORDER — METRONIDAZOLE 500 MG PO TABS: ORAL_TABLET | ORAL | 0 refills | Status: DC

## 2018-08-16 MED ORDER — RIZATRIPTAN BENZOATE 10 MG PO TABS: 10.0000 mg | ORAL_TABLET | ORAL | 1 refills | Status: DC | PRN

## 2018-08-16 NOTE — Patient Instructions (Signed)
I value your feedback and entrusting us with your care. If you get a [facility] patient survey, I would appreciate you taking the time to let us know about your experience today. Thank you! 

## 2018-08-16 NOTE — Progress Notes (Signed)
Pt's pap showed trich. Pt denies any sx/no new partners. RTO for wet prep/STD testing. Pos trich on wet prep today. Nuswab sent to lab. Will call with results.  Rx flagyl, no EtOH. Partner needs tx.  Rx RF maxalt since just picked up last RF.   N/C to pt given finances.

## 2018-08-19 LAB — CERVICOVAGINAL ANCILLARY ONLY
CHLAMYDIA, DNA PROBE: NEGATIVE
Neisseria Gonorrhea: NEGATIVE
TRICH (WINDOWPATH): POSITIVE — AB

## 2018-11-08 ENCOUNTER — Telehealth (INDEPENDENT_AMBULATORY_CARE_PROVIDER_SITE_OTHER): Payer: Self-pay | Admitting: Physician Assistant

## 2018-11-08 ENCOUNTER — Other Ambulatory Visit: Payer: Self-pay

## 2018-11-08 DIAGNOSIS — F419 Anxiety disorder, unspecified: Secondary | ICD-10-CM

## 2018-11-08 DIAGNOSIS — G43009 Migraine without aura, not intractable, without status migrainosus: Secondary | ICD-10-CM

## 2018-11-08 DIAGNOSIS — M62838 Other muscle spasm: Secondary | ICD-10-CM

## 2018-11-08 MED ORDER — METOPROLOL TARTRATE 25 MG PO TABS: 25.0000 mg | ORAL_TABLET | Freq: Two times a day (BID) | ORAL | 2 refills | Status: DC

## 2018-11-08 MED ORDER — CYCLOBENZAPRINE HCL 10 MG PO TABS: ORAL_TABLET | ORAL | 2 refills | Status: DC

## 2018-11-08 MED ORDER — SUMATRIPTAN SUCCINATE 100 MG PO TABS: 100.0000 mg | ORAL_TABLET | Freq: Once | ORAL | 11 refills | Status: DC | PRN

## 2018-11-11 DIAGNOSIS — F419 Anxiety disorder, unspecified: Secondary | ICD-10-CM | POA: Insufficient documentation

## 2018-11-11 DIAGNOSIS — M62838 Other muscle spasm: Secondary | ICD-10-CM | POA: Insufficient documentation

## 2018-11-11 DIAGNOSIS — F411 Generalized anxiety disorder: Secondary | ICD-10-CM | POA: Insufficient documentation

## 2018-11-11 DIAGNOSIS — G43009 Migraine without aura, not intractable, without status migrainosus: Secondary | ICD-10-CM | POA: Insufficient documentation

## 2018-11-11 NOTE — Progress Notes (Signed)
TELEHEALTH VIRTUAL GYNECOLOGY VISIT ENCOUNTER NOTE  I connected with Megan Hernandez on 11/11/18 at 10:15 AM EDT by telephone at home and verified that I am speaking with the correct person using two identifiers.   I discussed the limitations, risks, security and privacy concerns of performing an evaluation and management service by telephone and the availability of in person appointments. I also discussed with the patient that there may be a patient responsible charge related to this service. The patient expressed understanding and agreed to proceed.   History:  Megan Hernandez is a 48 y.o. [phone] female being evaluated today for new evaluation of headache.  She notes her headaches first started decades ago.  With those early headaches she would see stars and have profound changes with her peripheral vision.  More recently, she does not experience any warnings. They have been most common with her periods, lasting 5-6 days.  They get to be severe, feeling like a vice, worse on the left in the frontal region extending to the hairline and temple.  It can be on either side.  There is pulsing at times.  Lights and noises are bothersome.  Movement makes it worse.  It is accompanied by nausea and vomiting.  Earlier this month, she had continuous head pain for 2 weeks straight but it has been improved over the last week.  Some of the headaches cause her to awaken and are already severe.   She has used advil, tylenol, imitrex and maxalt for the pain.  Both triptans are helpful and she has found that she uses both as her headaches are more frequent than 9 days per month.  She also often requires 2 doses.  Rest is also helpful. 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   She notes her blood pressure is usually quite good with systolic 90-110 over 60ish.   Her heart rate reportedly goes up as high as 160 related to anxiety.       Past Medical History:  Diagnosis Date  .  Anxiety   . Depression   . 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 History of mammogram 11/12/213   birads 2 - benign  . History of Papanicolaou smear of cervix 07/14/2013   -/-  . Menstrual migraine   . SVT (supraventricular tachycardia) (HCC)   . Vaginal lesion 02/01/2015   Past Surgical History:  Procedure Laterality Date  . KNEE SURGERY  as a teen   As a teen   The following portions of the patient's history were reviewed and updated as appropriate: allergies, current medications, past family history, past medical history, past social history, past surgical history and problem list.     Review of Systems:  Pertinent items noted in HPI and remainder of comprehensive ROS otherwise negative.  Physical Exam:  Physical exam deferred due to nature of the encounter  Labs and Imaging No results found for this or any previous visit (from the past 336 hour(s)). No results found.    Assessment and Plan:     It is impossible to assess for aura as history of possible aura is quite remote.  Pt to report on this in visits to come. 1. Migraine without aura and without status migrainosus, not intractable   2. Anxiety   3. Muscle spasm    New diagnoses 1.  Imitrex for acute migraine.  May repeat x 1 additional dose after 2 hours.  Limit  to 2/day, 2 days per week.  Must not combine with other triptans.    1 &2.  Will begin metoprolol for migraine prevention.  She will begin with 1 daily and may increase to 2 per day as tolerated.  I anticipate this will help decrease frequency and severity of migraine as well as improve symptoms of anxiety.  She will monitor BP/pulse and symptoms.  If there are concerns, she is to contact the office immediately.  2.  If additional help needed with anxiety, pt advised to see mental health professional or her pcp.  3.  Muscle Spasm - Flexeril for HA or muscle spasm.  Use 1/2 tab initially.  Sedation precautions discussed.         I discussed the assessment  and treatment plan with the patient. The patient was provided an opportunity to ask questions and all were answered. The patient agreed with the plan and demonstrated an understanding of the instructions.   The patient was advised to call back or seek an in-person evaluation/go to the ED if the symptoms worsen or if the condition fails to improve as anticipated.  I provided 25 minutes of non-face-to-face time during this encounter.   Bertram Denver, PA-C [facility] [employer], [facility]

## 2018-11-11 NOTE — Patient Instructions (Signed)

## 2018-12-20 ENCOUNTER — Institutional Professional Consult (permissible substitution): Payer: Self-pay | Admitting: Physician Assistant

## 2019-04-09 ENCOUNTER — Encounter: Payer: Self-pay | Admitting: Obstetrics and Gynecology

## 2019-04-10 ENCOUNTER — Other Ambulatory Visit: Payer: Self-pay

## 2019-04-10 ENCOUNTER — Encounter: Payer: Self-pay | Admitting: Obstetrics and Gynecology

## 2019-04-10 ENCOUNTER — Ambulatory Visit (INDEPENDENT_AMBULATORY_CARE_PROVIDER_SITE_OTHER): Payer: 59 | Admitting: Obstetrics and Gynecology

## 2019-04-10 VITALS — BP 110/80 | Ht 65.0 in | Wt 163.0 lb

## 2019-04-10 DIAGNOSIS — L0231 Cutaneous abscess of buttock: Secondary | ICD-10-CM | POA: Diagnosis not present

## 2019-04-10 MED ORDER — DOXYCYCLINE HYCLATE 100 MG PO CAPS: 100.0000 mg | ORAL_CAPSULE | Freq: Two times a day (BID) | ORAL | 0 refills | Status: DC

## 2019-04-10 NOTE — Patient Instructions (Signed)
I value your feedback and entrusting us with your care. If you get a [facility] patient survey, I would appreciate you taking the time to let us know about your experience today. Thank you! 

## 2019-04-10 NOTE — Progress Notes (Signed)
Patient, No Pcp Per   Chief Complaint  Patient presents with  . Follow-up    Spot on right butt cheeck, feels like a big pimple about the size of a golf ball     HPI:      Ms. Megan Hernandez is a 48 y.o. [id number] who LMP was No LMP recorded. (Menstrual status: IUD)., presents today for bump on RT buttocks since last wk that has gotten large and painful. No d/c yet. Using warm compresses, hurts to sit. Had smaller place in same area a few wks ago that was like a pimple and resolved on its own. Pt hasn't used any meds to treat for pain but very uncomfortable for her, especially with sitting.  No fevers.    Past Medical History:  Diagnosis Date  . Anxiety   . Depression   . 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 History of mammogram 11/12/213   birads 2 - benign  . History of Papanicolaou smear of cervix 07/14/2013   -/-  . Menstrual migraine   . SVT (supraventricular tachycardia) ([place])   . Vaginal lesion 02/01/2015    Past Surgical History:  Procedure Laterality Date  . KNEE SURGERY  as a teen   As a teen    Family History  Problem Relation Age of Onset  . Osteoporosis Mother   . Diabetes Father        Type 2  . Hypertension Father   . Depression Father   . Chronic bronchitis Father        copd  . Bipolar disorder Father     Social History   Socioeconomic History  . Marital status: Divorced    Spouse name: Not on file  . Number of children: Not on file  . Years of education: Not on file  . Highest education level: Not on file  Occupational History  . Not on file  Social Needs  . Financial resource strain: Not on file  . Food insecurity    Worry: Not on file    Inability: Not on file  . Transportation needs    Medical: Not on file    Non-medical: Not on file  Tobacco Use  . Smoking status: Never Smoker  . Smokeless tobacco: Never Used  Substance and Sexual Activity  . Alcohol use: Yes    Comment: occ  . Drug use: No  . Sexual activity: Yes    Birth  control/protection: I.U.D.    Comment: Mirena  Lifestyle  . Physical activity    Days per week: Not on file    Minutes per session: Not on file  . Stress: Not on file  Relationships  . Social Herbalist on phone: Not on file    Gets together: Not on file    Attends religious service: Not on file    Active member of club or organization: Not on file    Attends meetings of clubs or organizations: Not on file    Relationship status: Not on file  . Intimate partner violence    Fear of current or ex partner: Not on file    Emotionally abused: Not on file    Physically abused: Not on file    Forced sexual activity: Not on file  Other Topics Concern  . Not on file  Social History Narrative  . Not on file    Outpatient Medications Prior to Visit  Medication Sig Dispense Refill  .  metoprolol tartrate (LOPRESSOR) 25 MG tablet Take 1 tablet (25 mg total) by mouth 2 (two) times daily. 60 tablet 2  . sertraline (ZOLOFT) 100 MG tablet Take 1 tablet (100 mg total) by mouth daily. 90 tablet 3  . SUMAtriptan (IMITREX) 100 MG tablet Take 1 tablet (100 mg total) by mouth once as needed for up to 1 dose for migraine. May repeat in 2 hours if headache persists or recurs. 9 tablet 11  . cyclobenzaprine (FLEXERIL) 10 MG tablet Use 1/2 to 1 tab as needed for headache or muscle spasm up to 3 times daily. (Patient not taking: Reported on 04/10/2019) 30 tablet 2  . levonorgestrel (MIRENA, 52 MG,) 20 MCG/24HR IUD 1 Intra Uterine Device (1 each total) by Intrauterine route once. 1 Intra Uterine Device 0  . valACYclovir (VALTREX) 500 MG tablet     . metroNIDAZOLE (FLAGYL) 500 MG tablet Take 2 tabs BID for 1 day 4 tablet 0  . rizatriptan (MAXALT) 10 MG tablet Take 1 tablet (10 mg total) by mouth as needed for up to 2 days for migraine. May repeat in 2 hours if needed 10 tablet 1   No facility-administered medications prior to visit.       ROS:  Review of Systems  Constitutional: Negative for  fever.  Gastrointestinal: Negative for blood in stool, constipation, diarrhea, nausea and vomiting.  Genitourinary: Negative for dyspareunia, dysuria, flank pain, frequency, hematuria, urgency, vaginal bleeding, vaginal discharge and vaginal pain.  Musculoskeletal: Negative for back pain.  Skin: Positive for wound. Negative for rash.   BREAST: No symptoms   OBJECTIVE:   Vitals:  BP 110/80   Ht 5\' 5"  (1.651 m)   Wt 163 lb (73.9 kg)   BMI 27.12 kg/m   Physical Exam Vitals signs reviewed.  Constitutional:      Appearance: She is well-developed.  Neck:     Musculoskeletal: Normal range of motion.  Pulmonary:     Effort: Pulmonary effort is normal.  Musculoskeletal: Normal range of motion.  Skin:    General: Skin is warm and dry.       Neurological:     General: No focal deficit present.     Mental Status: She is alert and oriented to person, place, and time.     Cranial Nerves: No cranial nerve deficit.  Psychiatric:        Mood and Affect: Mood normal.        Behavior: Behavior normal.        Thought Content: Thought content normal.        Judgment: Judgment normal.     Assessment/Plan: Cutaneous abscess of buttock - Cont warm compresses, add Rx doxy and NSAIDs prn pain. No place to I&D today. F/u prn sx. - Plan: doxycycline (VIBRAMYCIN) 100 MG capsule    Meds ordered this encounter  Medications  . doxycycline (VIBRAMYCIN) 100 MG capsule    Sig: Take 1 capsule (100 mg total) by mouth 2 (two) times daily.    Dispense:  28 capsule    Refill:  0    Order Specific Question:   Supervising Provider    Answer:   Nadara MustardHARRIS, ROBERT P [[id number]]      Return if symptoms worsen or fail to improve.   B. , PA-C 04/10/2019 4:48 PM

## 2019-04-14 ENCOUNTER — Encounter: Payer: Self-pay | Admitting: Surgery

## 2019-04-14 ENCOUNTER — Telehealth: Payer: Self-pay | Admitting: Obstetrics and Gynecology

## 2019-04-14 ENCOUNTER — Ambulatory Visit (INDEPENDENT_AMBULATORY_CARE_PROVIDER_SITE_OTHER): Payer: 59 | Admitting: Surgery

## 2019-04-14 ENCOUNTER — Encounter: Payer: Self-pay | Admitting: *Deleted

## 2019-04-14 ENCOUNTER — Other Ambulatory Visit: Payer: Self-pay

## 2019-04-14 ENCOUNTER — Other Ambulatory Visit
Admission: RE | Admit: 2019-04-14 | Discharge: 2019-04-14 | Disposition: A | Discharge status: Home / Self Care | Payer: 59 | Source: Ambulatory Visit | Attending: Surgery | Admitting: Surgery

## 2019-04-14 VITALS — BP 127/91 | HR 86 | Temp 97.4°F | Ht 65.0 in | Wt 162.0 lb

## 2019-04-14 DIAGNOSIS — Z20828 Contact with and (suspected) exposure to other viral communicable diseases: Secondary | ICD-10-CM | POA: Diagnosis not present

## 2019-04-14 DIAGNOSIS — Z01812 Encounter for preprocedural laboratory examination: Secondary | ICD-10-CM | POA: Diagnosis not present

## 2019-04-14 DIAGNOSIS — K611 Rectal abscess: Secondary | ICD-10-CM | POA: Diagnosis not present

## 2019-04-14 MED ORDER — SODIUM CHLORIDE 0.9 % IV SOLN
2.0000 g | INTRAVENOUS | Status: AC
  Administered 2019-04-15: 2 g via INTRAVENOUS
  Filled 2019-04-14: qty 2

## 2019-04-14 NOTE — Progress Notes (Signed)
Patient's surgery to be scheduled for 04-15-19 at [facility] with Dr. Dahlia Byes. Patient aware to arrive to [facility] at 6 am for surgery.   The patient is aware to have COVID-19 testing done on 04-14-19 at the [place] building drive thru ([address] [place]) as soon as she leaves the office. She is aware to isolate after, have no visitors, wash hands frequently, and avoid touching face.   Patient aware to be NPO after midnight and have a driver.   She is aware to check in at the [place] entrance where she will be screened for the coronavirus and then sent to Same Day Surgery.   Patient aware that she may have one visitor due to COVID-19 restrictions.   The patient verbalizes understanding of the above.   The patient is aware to call the office should she have further questions.

## 2019-04-14 NOTE — Telephone Encounter (Signed)
Thank you for seeing my pt today at 11:30. She was seen 04/10/19 for a skin abscess on her RT buttock and started on doxycycline. It has progressed to the pictures.

## 2019-04-14 NOTE — H&P (View-Only) (Signed)
Patient ID: Megan Hernandez, female   DOB: 04/12/1971, 48 y.o.   MRN: [id number]  HPI Megan Hernandez is a 48 y.o. female seen in consultation at the request of Mrs. Copland PA-C.  She reports that she has had a small spot on her right buttocks but a pimple size and over the last week or so has worsening.  She did see Ms. Copland 4 days ago and was prescribed doxycycline.  She now continues to have pain that is mild to moderate in nature.  And now has drainage on that side.  She also reports that the ulceration has increased in size.  No fevers no chills.  She has used warm compresses.  The symptoms get worse when she sits.  She denies any weight loss.  She is able to perform more than 4 METS of activity without any shortness of breath or chest pain.  HPI  Past Medical History:  Diagnosis Date  . Anxiety   . Depression   . 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 History of mammogram 11/12/213   birads 2 - benign  . History of Papanicolaou smear of cervix 07/14/2013   -/-  . Menstrual migraine   . SVT (supraventricular tachycardia) ([facility])   . Vaginal lesion 02/01/2015    Past Surgical History:  Procedure Laterality Date  . KNEE SURGERY  as a teen   As a teen    Family History  Problem Relation Age of Onset  . Osteoporosis Mother   . Diabetes Father        Type 2  . Hypertension Father   . Depression Father   . Chronic bronchitis Father        copd  . Bipolar disorder Father     Social History Social History   Tobacco Use  . Smoking status: Never Smoker  . Smokeless tobacco: Never Used  Substance Use Topics  . Alcohol use: Yes    Comment: occ  . Drug use: No    No Known Allergies  Current Outpatient Medications  Medication Sig Dispense Refill  . cyclobenzaprine (FLEXERIL) 10 MG tablet Use 1/2 to 1 tab as needed for headache or muscle spasm up to 3 times daily. 30 tablet 2  . doxycycline (VIBRAMYCIN) 100 MG capsule Take 1 capsule (100 mg total) by mouth 2 (two) times daily.  28 capsule 0  . metoprolol tartrate (LOPRESSOR) 25 MG tablet Take 1 tablet (25 mg total) by mouth 2 (two) times daily. 60 tablet 2  . sertraline (ZOLOFT) 100 MG tablet Take 1 tablet (100 mg total) by mouth daily. 90 tablet 3  . SUMAtriptan (IMITREX) 100 MG tablet Take 1 tablet (100 mg total) by mouth once as needed for up to 1 dose for migraine. May repeat in 2 hours if headache persists or recurs. 9 tablet 11  . valACYclovir (VALTREX) 500 MG tablet     . levonorgestrel (MIRENA, 52 MG,) 20 MCG/24HR IUD 1 Intra Uterine Device (1 each total) by Intrauterine route once. 1 Intra Uterine Device 0   No current facility-administered medications for this visit.      Review of Systems Full ROS  was asked and was negative except for the information on the HPI  Physical Exam Blood pressure (!) 127/91, pulse 86, temperature (!) 97.4 F (36.3 C), temperature source Skin, height 5' 5" (1.651 m), weight 162 lb (73.5 kg), SpO2 97 %. CONSTITUTIONAL: NAD EYES: Pupils are equal, round, and reactive to light, Sclera   are non-icteric. EARS, NOSE, MOUTH AND THROAT: The oropharynx is clear. The oral mucosa is pink and moist. Hearing is intact to voice. LYMPH NODES:  Lymph nodes in the neck are normal. RESPIRATORY:  Lungs are clear. There is normal respiratory effort, with equal breath sounds bilaterally, and without pathologic use of accessory muscles. CARDIOVASCULAR: Heart is regular without murmurs, gallops, or rubs. GI: The abdomen is  soft, nontender, and nondistended. There are no palpable masses. There is no hepatosplenomegaly. There are normal bowel sounds in all quadrants. GU: Rectal deferred.   MUSCULOSKELETAL: Normal muscle strength and tone. No cyanosis or edema.   SKIN: There is evidence of right gluteal/perirectal ulceration and cavity with erythema fluctuance.  It is tender to palpation.  There is no evidence of necrotizing infection.   nEUROLOGIC: Motor and sensation is grossly normal. Cranial  nerves are grossly intact. PSYCH:  Oriented to person, place and time. Affect is normal.  Data Reviewed  I have personally reviewed the patient's imaging, laboratory findings and medical records.    Assessment/Plan 48 year old female with findings consistent of a right gluteal/perirectal abscess with some areas that are in need for debridement.  Discussed with the patient in detail and I do recommend I&D as well as debridement formally in the OR.  Procedure discussed with the patient detail.  Risk benefit and possible complications including but not limited to: Bleeding, infection, chronic pain and delayed wound healing.  She understands and wishes to proceed.  We will definitely test her for COVID and placed on the schedule for tomorrow.  A copy of this report was sent to the referring provider  Caroleen Hamman, MD FACS General Surgeon 04/14/2019, 11:49 AM

## 2019-04-14 NOTE — Progress Notes (Signed)
Patient ID: Megan SayreJanet L Hernandez, female   DOB: 08/29/1970, 48 y.o.   MRN: [id number]  HPI Megan Hernandez is a 48 y.o. female seen in consultation at the request of Mrs. Copland PA-C.  She reports that she has had a small spot on her right buttocks but a pimple size and over the last week or so has worsening.  She did see Ms. Copland 4 days ago and was prescribed doxycycline.  She now continues to have pain that is mild to moderate in nature.  And now has drainage on that side.  She also reports that the ulceration has increased in size.  No fevers no chills.  She has used warm compresses.  The symptoms get worse when she sits.  She denies any weight loss.  She is able to perform more than 4 METS of activity without any shortness of breath or chest pain.  HPI  Past Medical History:  Diagnosis Date  . Anxiety   . Depression   . 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 History of mammogram 11/12/213   birads 2 - benign  . History of Papanicolaou smear of cervix 07/14/2013   -/-  . Menstrual migraine   . SVT (supraventricular tachycardia) ([facility])   . Vaginal lesion 02/01/2015    Past Surgical History:  Procedure Laterality Date  . KNEE SURGERY  as a teen   As a teen    Family History  Problem Relation Age of Onset  . Osteoporosis Mother   . Diabetes Father        Type 2  . Hypertension Father   . Depression Father   . Chronic bronchitis Father        copd  . Bipolar disorder Father     Social History Social History   Tobacco Use  . Smoking status: Never Smoker  . Smokeless tobacco: Never Used  Substance Use Topics  . Alcohol use: Yes    Comment: occ  . Drug use: No    No Known Allergies  Current Outpatient Medications  Medication Sig Dispense Refill  . cyclobenzaprine (FLEXERIL) 10 MG tablet Use 1/2 to 1 tab as needed for headache or muscle spasm up to 3 times daily. 30 tablet 2  . doxycycline (VIBRAMYCIN) 100 MG capsule Take 1 capsule (100 mg total) by mouth 2 (two) times daily.  28 capsule 0  . metoprolol tartrate (LOPRESSOR) 25 MG tablet Take 1 tablet (25 mg total) by mouth 2 (two) times daily. 60 tablet 2  . sertraline (ZOLOFT) 100 MG tablet Take 1 tablet (100 mg total) by mouth daily. 90 tablet 3  . SUMAtriptan (IMITREX) 100 MG tablet Take 1 tablet (100 mg total) by mouth once as needed for up to 1 dose for migraine. May repeat in 2 hours if headache persists or recurs. 9 tablet 11  . valACYclovir (VALTREX) 500 MG tablet     . levonorgestrel (MIRENA, 52 MG,) 20 MCG/24HR IUD 1 Intra Uterine Device (1 each total) by Intrauterine route once. 1 Intra Uterine Device 0   No current facility-administered medications for this visit.      Review of Systems Full ROS  was asked and was negative except for the information on the HPI  Physical Exam Blood pressure (!) 127/91, pulse 86, temperature (!) 97.4 F (36.3 C), temperature source Skin, height 5\' 5"  (1.651 m), weight 162 lb (73.5 kg), SpO2 97 %. CONSTITUTIONAL: NAD EYES: Pupils are equal, round, and reactive to light, Sclera  are non-icteric. EARS, NOSE, MOUTH AND THROAT: The oropharynx is clear. The oral mucosa is pink and moist. Hearing is intact to voice. LYMPH NODES:  Lymph nodes in the neck are normal. RESPIRATORY:  Lungs are clear. There is normal respiratory effort, with equal breath sounds bilaterally, and without pathologic use of accessory muscles. CARDIOVASCULAR: Heart is regular without murmurs, gallops, or rubs. GI: The abdomen is  soft, nontender, and nondistended. There are no palpable masses. There is no hepatosplenomegaly. There are normal bowel sounds in all quadrants. GU: Rectal deferred.   MUSCULOSKELETAL: Normal muscle strength and tone. No cyanosis or edema.   SKIN: There is evidence of right gluteal/perirectal ulceration and cavity with erythema fluctuance.  It is tender to palpation.  There is no evidence of necrotizing infection.   nEUROLOGIC: Motor and sensation is grossly normal. Cranial  nerves are grossly intact. PSYCH:  Oriented to person, place and time. Affect is normal.  Data Reviewed  I have personally reviewed the patient's imaging, laboratory findings and medical records.    Assessment/Plan 48 year old female with findings consistent of a right gluteal/perirectal abscess with some areas that are in need for debridement.  Discussed with the patient in detail and I do recommend I&D as well as debridement formally in the OR.  Procedure discussed with the patient detail.  Risk benefit and possible complications including but not limited to: Bleeding, infection, chronic pain and delayed wound healing.  She understands and wishes to proceed.  We will definitely test her for COVID and placed on the schedule for tomorrow.  A copy of this report was sent to the referring provider  Caroleen Hamman, MD FACS General Surgeon 04/14/2019, 11:49 AM

## 2019-04-14 NOTE — Patient Instructions (Addendum)
Patient to have surgery tomorrow at [facility].

## 2019-04-14 NOTE — Telephone Encounter (Signed)
Pt sent me pictures of worsening RT buttock abscess. I got an appt for her with Dr. Dahlia Byes at [facility] at 11:30. Pt to be seen there.  Pain is decreased, no fevers, small amt of drainage of buttock. Want their opinion for wound mgmt/care.

## 2019-04-15 ENCOUNTER — Encounter
Admission: RE | Disposition: A | Discharge status: Home / Self Care | Payer: Self-pay | Source: Home / Self Care | Attending: Surgery

## 2019-04-15 ENCOUNTER — Ambulatory Visit
Admission: RE | Admit: 2019-04-15 | Discharge: 2019-04-15 | Disposition: A | Discharge status: Home / Self Care | Payer: 59 | Attending: Surgery | Admitting: Surgery

## 2019-04-15 ENCOUNTER — Other Ambulatory Visit: Payer: Self-pay

## 2019-04-15 ENCOUNTER — Ambulatory Visit: Payer: 59 | Admitting: Certified Registered"

## 2019-04-15 DIAGNOSIS — L0231 Cutaneous abscess of buttock: Secondary | ICD-10-CM | POA: Insufficient documentation

## 2019-04-15 DIAGNOSIS — F419 Anxiety disorder, unspecified: Secondary | ICD-10-CM | POA: Diagnosis not present

## 2019-04-15 DIAGNOSIS — F329 Major depressive disorder, single episode, unspecified: Secondary | ICD-10-CM | POA: Insufficient documentation

## 2019-04-15 DIAGNOSIS — K611 Rectal abscess: Secondary | ICD-10-CM | POA: Diagnosis not present

## 2019-04-15 DIAGNOSIS — Z793 Long term (current) use of hormonal contraceptives: Secondary | ICD-10-CM | POA: Insufficient documentation

## 2019-04-15 DIAGNOSIS — Z79899 Other long term (current) drug therapy: Secondary | ICD-10-CM | POA: Insufficient documentation

## 2019-04-15 DIAGNOSIS — Z975 Presence of (intrauterine) contraceptive device: Secondary | ICD-10-CM | POA: Diagnosis not present

## 2019-04-15 HISTORY — PX: INCISION AND DRAINAGE PERIRECTAL ABSCESS: SHX1804

## 2019-04-15 LAB — SARS CORONAVIRUS 2 (TAT 6-24 HRS): SARS Coronavirus 2: NEGATIVE

## 2019-04-15 LAB — POCT PREGNANCY, URINE: Preg Test, Ur: NEGATIVE

## 2019-04-15 SURGERY — INCISION AND DRAINAGE, ABSCESS, PERIRECTAL
Anesthesia: General | Laterality: Right

## 2019-04-15 MED ORDER — SUGAMMADEX SODIUM 200 MG/2ML IV SOLN
INTRAVENOUS | Status: AC
  Filled 2019-04-15: qty 2

## 2019-04-15 MED ORDER — CHLORHEXIDINE GLUCONATE CLOTH 2 % EX PADS
6.0000 | MEDICATED_PAD | Freq: Once | CUTANEOUS | Status: AC
  Administered 2019-04-15: 6 via TOPICAL

## 2019-04-15 MED ORDER — ROCURONIUM BROMIDE 100 MG/10ML IV SOLN
INTRAVENOUS | Status: DC | PRN
  Administered 2019-04-15: 40 mg via INTRAVENOUS

## 2019-04-15 MED ORDER — PROPOFOL 10 MG/ML IV BOLUS
INTRAVENOUS | Status: DC | PRN
  Administered 2019-04-15: 150 mg via INTRAVENOUS

## 2019-04-15 MED ORDER — ACETAMINOPHEN 500 MG PO TABS
ORAL_TABLET | ORAL | Status: AC
  Administered 2019-04-15: 10:00:00 1000 mg via ORAL
  Filled 2019-04-15: qty 2

## 2019-04-15 MED ORDER — FENTANYL CITRATE (PF) 100 MCG/2ML IJ SOLN: 25.0000 ug | INTRAMUSCULAR | Status: DC | PRN

## 2019-04-15 MED ORDER — LIDOCAINE HCL (PF) 2 % IJ SOLN
INTRAMUSCULAR | Status: AC
  Filled 2019-04-15: qty 10

## 2019-04-15 MED ORDER — ONDANSETRON HCL 4 MG/2ML IJ SOLN: 4.0000 mg | Freq: Once | INTRAMUSCULAR | Status: DC | PRN

## 2019-04-15 MED ORDER — DEXAMETHASONE SODIUM PHOSPHATE 10 MG/ML IJ SOLN
INTRAMUSCULAR | Status: DC | PRN
  Administered 2019-04-15: 20 mg via INTRAVENOUS

## 2019-04-15 MED ORDER — MIDAZOLAM HCL 2 MG/2ML IJ SOLN
INTRAMUSCULAR | Status: AC
  Filled 2019-04-15: qty 2

## 2019-04-15 MED ORDER — ONDANSETRON HCL 4 MG/2ML IJ SOLN
INTRAMUSCULAR | Status: AC
  Filled 2019-04-15: qty 2

## 2019-04-15 MED ORDER — PROPOFOL 10 MG/ML IV BOLUS
INTRAVENOUS | Status: AC
  Filled 2019-04-15: qty 20

## 2019-04-15 MED ORDER — FAMOTIDINE 20 MG PO TABS
ORAL_TABLET | ORAL | Status: AC
  Administered 2019-04-15: 20 mg via ORAL
  Filled 2019-04-15: qty 1

## 2019-04-15 MED ORDER — GABAPENTIN 300 MG PO CAPS
ORAL_CAPSULE | ORAL | Status: AC
  Administered 2019-04-15: 300 mg via ORAL
  Filled 2019-04-15: qty 1

## 2019-04-15 MED ORDER — HYDROCODONE-ACETAMINOPHEN 5-325 MG PO TABS: 1.0000 | ORAL_TABLET | Freq: Four times a day (QID) | ORAL | 0 refills | Status: DC | PRN

## 2019-04-15 MED ORDER — FENTANYL CITRATE (PF) 100 MCG/2ML IJ SOLN
INTRAMUSCULAR | Status: DC | PRN
  Administered 2019-04-15: 50 ug via INTRAVENOUS

## 2019-04-15 MED ORDER — LIDOCAINE HCL (CARDIAC) PF 100 MG/5ML IV SOSY
PREFILLED_SYRINGE | INTRAVENOUS | Status: DC | PRN
  Administered 2019-04-15: 60 mg via INTRAVENOUS

## 2019-04-15 MED ORDER — SUGAMMADEX SODIUM 200 MG/2ML IV SOLN
INTRAVENOUS | Status: DC | PRN
  Administered 2019-04-15: 200 mg via INTRAVENOUS

## 2019-04-15 MED ORDER — DEXAMETHASONE SODIUM PHOSPHATE 10 MG/ML IJ SOLN
INTRAMUSCULAR | Status: AC
  Filled 2019-04-15: qty 1

## 2019-04-15 MED ORDER — GABAPENTIN 300 MG PO CAPS
300.0000 mg | ORAL_CAPSULE | ORAL | Status: AC
  Administered 2019-04-15: 10:00:00 300 mg via ORAL

## 2019-04-15 MED ORDER — PHENYLEPHRINE HCL (PRESSORS) 10 MG/ML IV SOLN
INTRAVENOUS | Status: DC | PRN
  Administered 2019-04-15 (×2): 100 ug via INTRAVENOUS
  Administered 2019-04-15: 150 ug via INTRAVENOUS

## 2019-04-15 MED ORDER — CELECOXIB 200 MG PO CAPS
ORAL_CAPSULE | ORAL | Status: AC
  Administered 2019-04-15: 10:00:00 200 mg via ORAL
  Filled 2019-04-15: qty 1

## 2019-04-15 MED ORDER — ROCURONIUM BROMIDE 50 MG/5ML IV SOLN
INTRAVENOUS | Status: AC
  Filled 2019-04-15: qty 1

## 2019-04-15 MED ORDER — CELECOXIB 200 MG PO CAPS
200.0000 mg | ORAL_CAPSULE | ORAL | Status: AC
  Administered 2019-04-15: 10:00:00 200 mg via ORAL

## 2019-04-15 MED ORDER — MIDAZOLAM HCL 2 MG/2ML IJ SOLN
INTRAMUSCULAR | Status: DC | PRN
  Administered 2019-04-15: 2 mg via INTRAVENOUS

## 2019-04-15 MED ORDER — BUPIVACAINE-EPINEPHRINE (PF) 0.25% -1:200000 IJ SOLN
INTRAMUSCULAR | Status: AC
  Filled 2019-04-15: qty 30

## 2019-04-15 MED ORDER — PHENYLEPHRINE HCL (PRESSORS) 10 MG/ML IV SOLN
INTRAVENOUS | Status: AC
  Filled 2019-04-15: qty 1

## 2019-04-15 MED ORDER — FAMOTIDINE 20 MG PO TABS
20.0000 mg | ORAL_TABLET | Freq: Once | ORAL | Status: AC
  Administered 2019-04-15: 10:00:00 20 mg via ORAL

## 2019-04-15 MED ORDER — ACETAMINOPHEN 500 MG PO TABS
1000.0000 mg | ORAL_TABLET | ORAL | Status: AC
  Administered 2019-04-15: 10:00:00 1000 mg via ORAL

## 2019-04-15 MED ORDER — ONDANSETRON HCL 4 MG/2ML IJ SOLN
INTRAMUSCULAR | Status: DC | PRN
  Administered 2019-04-15: 4 mg via INTRAVENOUS

## 2019-04-15 MED ORDER — LACTATED RINGERS IV SOLN
INTRAVENOUS | Status: DC
  Administered 2019-04-15: 10:00:00 via INTRAVENOUS

## 2019-04-15 MED ORDER — FENTANYL CITRATE (PF) 100 MCG/2ML IJ SOLN
INTRAMUSCULAR | Status: AC
  Filled 2019-04-15: qty 2

## 2019-04-15 MED ORDER — CHLORHEXIDINE GLUCONATE CLOTH 2 % EX PADS
6.0000 | MEDICATED_PAD | Freq: Once | CUTANEOUS | Status: AC
  Administered 2019-04-15: 10:00:00 6 via TOPICAL

## 2019-04-15 MED ORDER — BUPIVACAINE-EPINEPHRINE (PF) 0.25% -1:200000 IJ SOLN
INTRAMUSCULAR | Status: DC | PRN
  Administered 2019-04-15: 30 mL

## 2019-04-15 SURGICAL SUPPLY — 27 items
BLADE CLIPPER SURG (BLADE) ×3 IMPLANT
BLADE SURG 15 STRL LF DISP TIS (BLADE) ×1 IMPLANT
BLADE SURG 15 STRL SS (BLADE) ×2
BRUSH SCRUB EZ  4% CHG (MISCELLANEOUS) ×2
BRUSH SCRUB EZ 4% CHG (MISCELLANEOUS) ×1 IMPLANT
CANISTER SUCT 1200ML W/VALVE (MISCELLANEOUS) ×3 IMPLANT
COVER WAND RF STERILE (DRAPES) ×3 IMPLANT
DRAPE LAPAROTOMY T 102X78X121 (DRAPES) ×3 IMPLANT
DRAPE LEGGINS SURG 28X43 STRL (DRAPES) IMPLANT
DRAPE UNDER BUTTOCK W/FLU (DRAPES) IMPLANT
ELECT CAUTERY BLADE 6.4 (BLADE) ×3 IMPLANT
ELECT REM PT RETURN 9FT ADLT (ELECTROSURGICAL) ×3
ELECTRODE REM PT RTRN 9FT ADLT (ELECTROSURGICAL) ×1 IMPLANT
GAUZE PACKING 1/4 X5 YD (GAUZE/BANDAGES/DRESSINGS) ×3 IMPLANT
GLOVE BIO SURGEON STRL SZ7 (GLOVE) ×9 IMPLANT
GOWN STRL REUS W/ TWL LRG LVL3 (GOWN DISPOSABLE) ×3 IMPLANT
GOWN STRL REUS W/TWL LRG LVL3 (GOWN DISPOSABLE) ×6
NEEDLE HYPO 22GX1.5 SAFETY (NEEDLE) ×3 IMPLANT
NS IRRIG 1000ML POUR BTL (IV SOLUTION) ×3 IMPLANT
PACK BASIN MINOR ARMC (MISCELLANEOUS) ×3 IMPLANT
PAD PREP 24X41 OB/GYN DISP (PERSONAL CARE ITEMS) ×3 IMPLANT
SOL PREP PVP 2OZ (MISCELLANEOUS) ×3
SOLUTION PREP PVP 2OZ (MISCELLANEOUS) ×1 IMPLANT
SPONGE LAP 18X18 RF (DISPOSABLE) ×3 IMPLANT
SURGILUBE 2OZ TUBE FLIPTOP (MISCELLANEOUS) ×3 IMPLANT
SWAB DUAL CULTURE TRANS RED ST (MISCELLANEOUS) ×3 IMPLANT
SYR 20ML LL LF (SYRINGE) ×3 IMPLANT

## 2019-04-15 NOTE — Anesthesia Post-op Follow-up Note (Signed)
Anesthesia QCDR form completed.        

## 2019-04-15 NOTE — Anesthesia Preprocedure Evaluation (Signed)
Anesthesia Evaluation  Patient identified by MRN, date of birth, ID band Patient awake    Reviewed: Allergy & Precautions, H&P , NPO status , Patient's Chart, lab work & pertinent test results, reviewed documented beta blocker date and time   Airway Mallampati: II  TM Distance: >3 FB Neck ROM: full    Dental  (+) Teeth Intact   Pulmonary neg pulmonary ROS,    Pulmonary exam normal        Cardiovascular negative cardio ROS Normal cardiovascular exam Rhythm:regular Rate:Normal     Neuro/Psych  Headaches, negative neurological ROS  negative psych ROS   GI/Hepatic negative GI ROS, Neg liver ROS,   Endo/Other  negative endocrine ROS  Renal/GU negative Renal ROS  negative genitourinary   Musculoskeletal   Abdominal   Peds  Hematology negative hematology ROS (+)   Anesthesia Other Findings Past Medical History: No date: Anxiety No date: Depression 01/2015: Herpes     Comment:  type 2 on cx 11/12/213: History of mammogram     Comment:  birads 2 - benign 07/14/2013: History of Papanicolaou smear of cervix     Comment:  -/- No date: Menstrual migraine No date: SVT (supraventricular tachycardia) ([place]) 02/01/2015: Vaginal lesion Past Surgical History: as a teen: KNEE SURGERY     Comment:  As a teen   Reproductive/Obstetrics negative OB ROS                             Anesthesia Physical Anesthesia Plan  ASA: II  Anesthesia Plan: General ETT   Post-op Pain Management:    Induction:   PONV Risk Score and Plan: 4 or greater  Airway Management Planned:   Additional Equipment:   Intra-op Plan:   Post-operative Plan:   Informed Consent: I have reviewed the patients History and Physical, chart, labs and discussed the procedure including the risks, benefits and alternatives for the proposed anesthesia with the patient or authorized representative who has indicated his/her  understanding and acceptance.     Dental Advisory Given  Plan Discussed with: CRNA  Anesthesia Plan Comments:         Anesthesia Quick Evaluation

## 2019-04-15 NOTE — Interval H&P Note (Signed)
History and Physical Interval Note:  04/15/2019 9:58 AM  Jefferson Fuel  has presented today for surgery, with the diagnosis of L02.31 abscess of buttock.  The various methods of treatment have been discussed with the patient and family. After consideration of risks, benefits and other options for treatment, the patient has consented to  Procedure(s): IRRIGATION AND DEBRIDEMENT PERIRECTAL ABSCESS (Right) as a surgical intervention.  The patient's history has been reviewed, patient examined, no change in status, stable for surgery.  I have reviewed the patient's chart and labs.  Questions were answered to the patient's satisfaction.     [place]

## 2019-04-15 NOTE — Transfer of Care (Signed)
Immediate Anesthesia Transfer of Care Note  Patient: Megan Hernandez  Procedure(s) Performed: IRRIGATION AND DEBRIDEMENT PERIRECTAL ABSCESS (Right )  Patient Location: PACU  Anesthesia Type:General  Level of Consciousness: drowsy and patient cooperative  Airway & Oxygen Therapy: Patient Spontanous Breathing and Patient connected to face mask oxygen  Post-op Assessment: Report given to RN and Post -op Vital signs reviewed and stable  Post vital signs: stable  Last Vitals:  Vitals Value Taken Time  BP 113/64 04/15/19 1117  Temp    Pulse 79 04/15/19 1119  Resp 15 04/15/19 1119  SpO2 100 % 04/15/19 1119  Vitals shown include unvalidated device data.  Last Pain:  Vitals:   04/15/19 0958  TempSrc: Temporal  PainSc: 0-No pain         Complications: No apparent anesthesia complications

## 2019-04-15 NOTE — Anesthesia Procedure Notes (Signed)
Procedure Name: Intubation Date/Time: 04/15/2019 10:33 AM Performed by: Lavone Orn, CRNA Pre-anesthesia Checklist: Patient identified, Emergency Drugs available, Suction available, Patient being monitored and Timeout performed Patient Re-evaluated:Patient Re-evaluated prior to induction Oxygen Delivery Method: Circle system utilized Preoxygenation: Pre-oxygenation with 100% oxygen Induction Type: IV induction Ventilation: Mask ventilation without difficulty Laryngoscope Size: Mac and 3 Grade View: Grade I Tube type: Oral Tube size: 7.0 mm Number of attempts: 1 Airway Equipment and Method: Stylet Placement Confirmation: ETT inserted through vocal cords under direct vision,  positive ETCO2 and breath sounds checked- equal and bilateral Secured at: 22 cm Tube secured with: Tape Dental Injury: Teeth and Oropharynx as per pre-operative assessment

## 2019-04-15 NOTE — Discharge Instructions (Signed)

## 2019-04-15 NOTE — Op Note (Signed)
   04/15/2019  11:01 AM  PATIENT:  Megan Hernandez  48 y.o. female  PRE-OPERATIVE DIAGNOSIS:  Right Gluteal abscess  POST-OPERATIVE DIAGNOSIS:  Same  PROCEDURE:  1. Incision and drainage of complex Gluteal /perirectal abscess                           2. Excisional debridement of skin subcutaneous tissue  measuring 6 square centimeters ( 3x2cm)    SURGEON:  Surgeon(s) and Role:    * Revel Stellmach F, MD - Primary    ANESTHESIA: GETA  INDICATIONS FOR PROCEDURE Gluteal abscess  DICTATION:  Patient was explained about the  Procedure in detail. Risks, benefits, possible complications and a consent was obtained. The patient taken to the operating room and placed in the prone position. Incision was created and 1 cc of pus was drained and cultured. There was complex luculations that we were able to lyse with a combination of finger fracture and suction device. All the loculations were broken down. Using a sharp curette we debrided the sub q tissue down to the fat. Hemostasis was obtained with electrocautery. Irrigation with normal saline and the wound was packed with 1/4-inch packing. Marcaine quarter percent with epinephrine was injected around the wound site. Needle and laparotomy counts were correct and there were no immediate complications  Jules Husbands, MD

## 2019-04-16 LAB — SURGICAL PATHOLOGY

## 2019-04-16 NOTE — Anesthesia Postprocedure Evaluation (Signed)
Anesthesia Post Note  Patient: Megan Hernandez  Procedure(s) Performed: IRRIGATION AND DEBRIDEMENT PERIRECTAL ABSCESS (Right )  Patient location during evaluation: PACU Anesthesia Type: General Level of consciousness: awake and alert Pain management: pain level controlled Vital Signs Assessment: post-procedure vital signs reviewed and stable Respiratory status: spontaneous breathing, nonlabored ventilation, respiratory function stable and patient connected to nasal cannula oxygen Cardiovascular status: blood pressure returned to baseline and stable Postop Assessment: no apparent nausea or vomiting Anesthetic complications: no     Last Vitals:  Vitals:   04/15/19 1215 04/15/19 1242  BP: 112/72 113/70  Pulse:  79  Resp: 16 18  Temp: (!) 36.3 C   SpO2: 100% 100%    Last Pain:  Vitals:   04/16/19 0812  TempSrc:   PainSc: 2                  Molli Barrows

## 2019-04-20 LAB — AEROBIC/ANAEROBIC CULTURE W GRAM STAIN (SURGICAL/DEEP WOUND)

## 2019-04-23 ENCOUNTER — Telehealth: Payer: Self-pay

## 2019-04-23 ENCOUNTER — Telehealth: Payer: Self-pay | Admitting: *Deleted

## 2019-04-23 NOTE — Telephone Encounter (Signed)
Spoke with patient, [employer] will fax new form over to be completed.

## 2019-04-23 NOTE — Telephone Encounter (Signed)
Patient called the office back and asked to speak with Freda Munro, CMA. Freda Munro asked that I take a message as she is in clinic seeing patients at present.   The patient states that she received a message from [employer] that states Part C and Part D need to be completed on her FMLA forms and refaxed.   If questions, the patient may be reached at [phone].  Note routed to [place].

## 2019-04-23 NOTE — Telephone Encounter (Signed)
FMLA completed and faxed to [employer] [phone]. Copy placed in scan folder.

## 2019-04-24 ENCOUNTER — Encounter: Payer: Self-pay | Admitting: Obstetrics and Gynecology

## 2019-04-24 NOTE — Telephone Encounter (Signed)
Patient wanted me to forward this message to you through MyChart since she couldn't do it herself.  Thank you for your assistance with her care.  Kasidi Shanker

## 2019-04-30 ENCOUNTER — Other Ambulatory Visit: Payer: Self-pay

## 2019-04-30 ENCOUNTER — Ambulatory Visit (INDEPENDENT_AMBULATORY_CARE_PROVIDER_SITE_OTHER): Payer: 59 | Admitting: Surgery

## 2019-04-30 ENCOUNTER — Encounter: Payer: Self-pay | Admitting: Surgery

## 2019-04-30 DIAGNOSIS — L0231 Cutaneous abscess of buttock: Secondary | ICD-10-CM

## 2019-04-30 MED ORDER — METOPROLOL TARTRATE 25 MG PO TABS: 25.0000 mg | ORAL_TABLET | Freq: Two times a day (BID) | ORAL | 0 refills | Status: DC

## 2019-04-30 MED ORDER — DOXYCYCLINE HYCLATE 100 MG PO CAPS: 100.0000 mg | ORAL_CAPSULE | Freq: Two times a day (BID) | ORAL | 0 refills | Status: AC

## 2019-04-30 NOTE — Patient Instructions (Signed)
Return in two weeks.  

## 2019-05-02 NOTE — Progress Notes (Signed)
S/p I/d perirectal abscess Doing well Cultures MRSA PE NAD Wound healing well, good granulation. No abscess   A/ p Doign well Since there still some open wound and pt is concerned for mrsa infection I will extend a/bs rx No need for surgical intervention

## 2019-05-13 ENCOUNTER — Telehealth: Payer: Self-pay

## 2019-05-13 NOTE — Telephone Encounter (Signed)
FMLA paperwork completed and faxed to [employer] [phone]. Patient notified and return to work date 05/19/2019. Placed in scan folder.

## 2019-05-14 ENCOUNTER — Other Ambulatory Visit: Payer: Self-pay

## 2019-05-14 ENCOUNTER — Ambulatory Visit (INDEPENDENT_AMBULATORY_CARE_PROVIDER_SITE_OTHER): Payer: 59 | Admitting: Surgery

## 2019-05-14 ENCOUNTER — Encounter: Payer: Self-pay | Admitting: Surgery

## 2019-05-14 VITALS — BP 94/64 | HR 64 | Temp 97.5°F | Resp 12 | Ht 65.0 in | Wt 163.4 lb

## 2019-05-14 DIAGNOSIS — L0231 Cutaneous abscess of buttock: Secondary | ICD-10-CM

## 2019-05-14 NOTE — Patient Instructions (Signed)
Please follow up as needed. If you have any questions or concerns please give [place] a call.

## 2019-05-15 NOTE — Progress Notes (Signed)
S/p I/d  abscess Doing well, almost completely healed, no fevers or chills. Minimal discomfort Cultures MRSA  PE NAD Abd: soft , nt. Wound healing well, good granulation. No abscess  Neuro: GCS 15, no motor or sens deficits  A/ p Doign well Resolved abscess and infection No need for surgical intervention

## 2019-09-14 IMAGING — DX DG CHEST 1V
1 series · 2 of 2 positions shown · non-contrast
Comparison: None.

CLINICAL DATA: Pain

EXAM:
CHEST  1 VIEW

[Series 1: chest ap · 0.14mm/px · 2 of 2 slices shown]
[im 1/2]
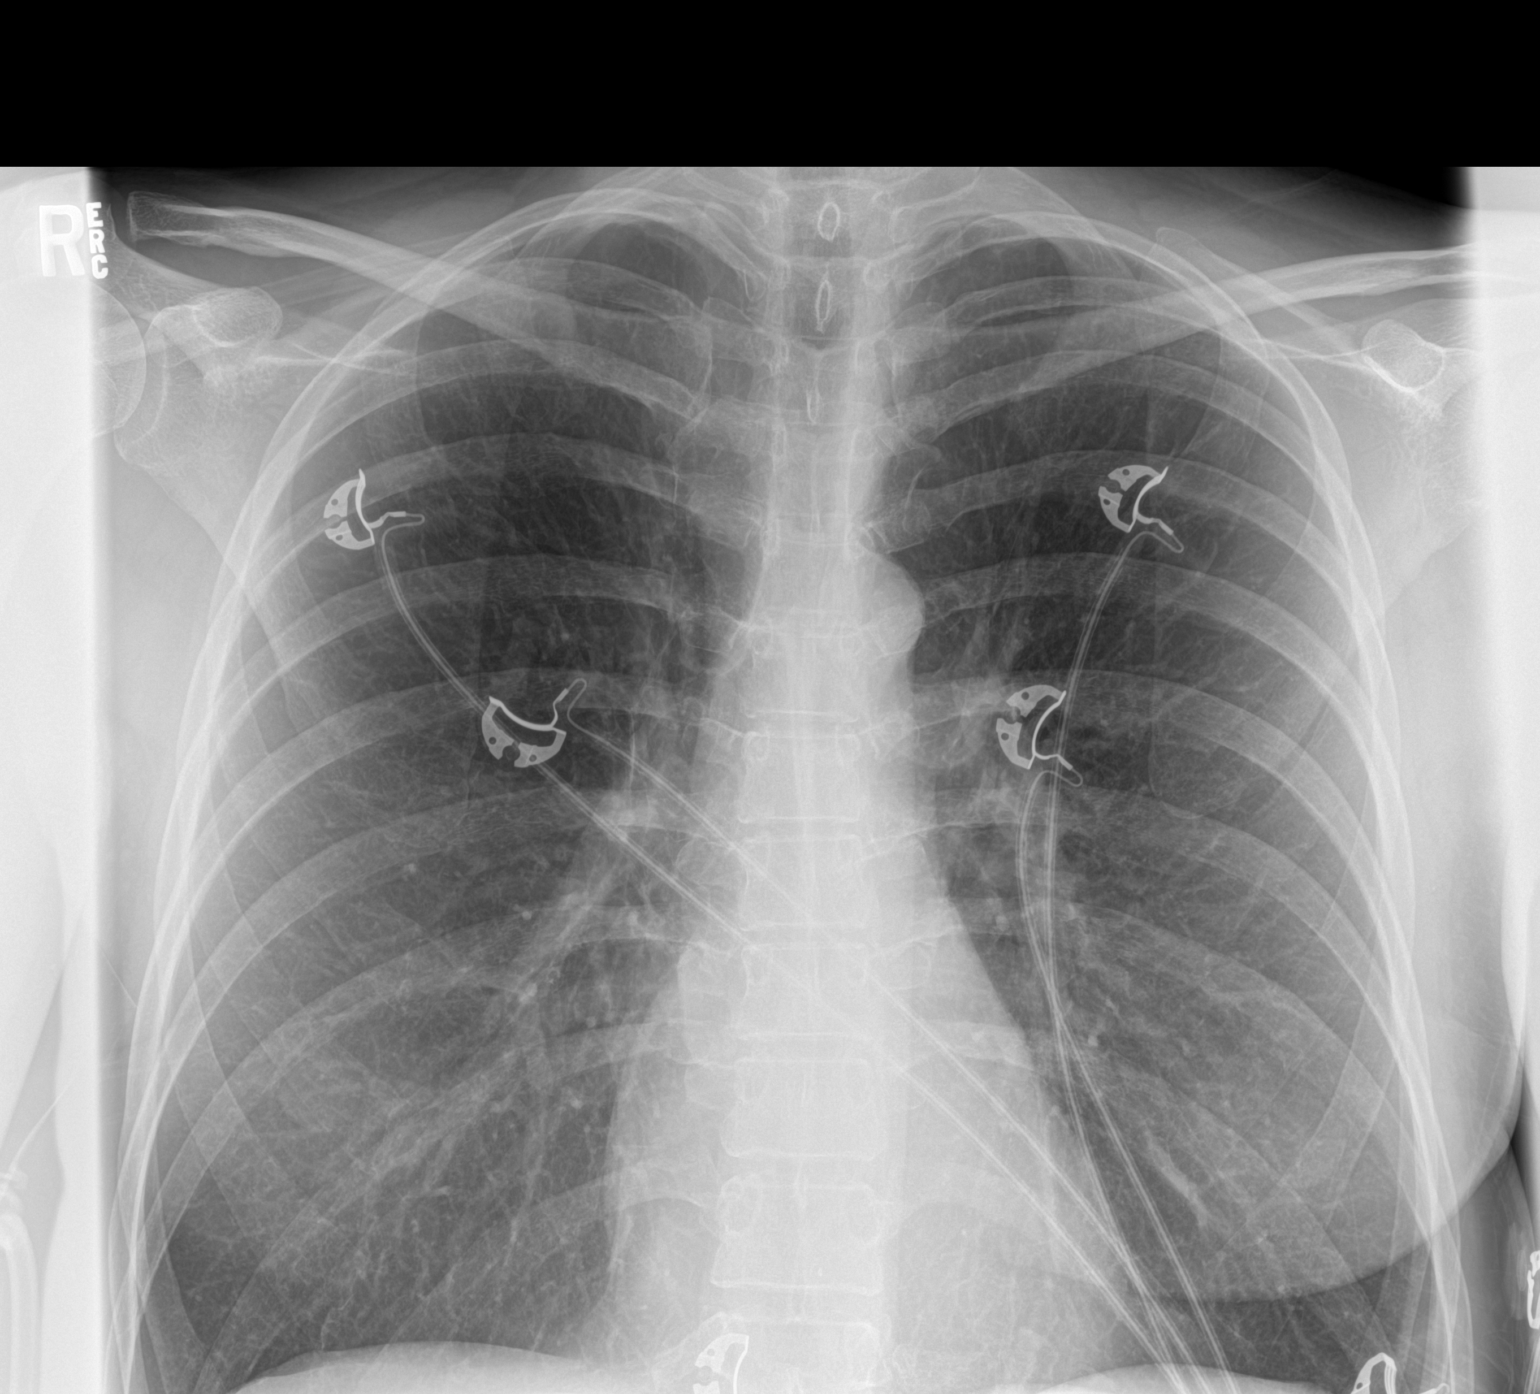
[im 2/2]
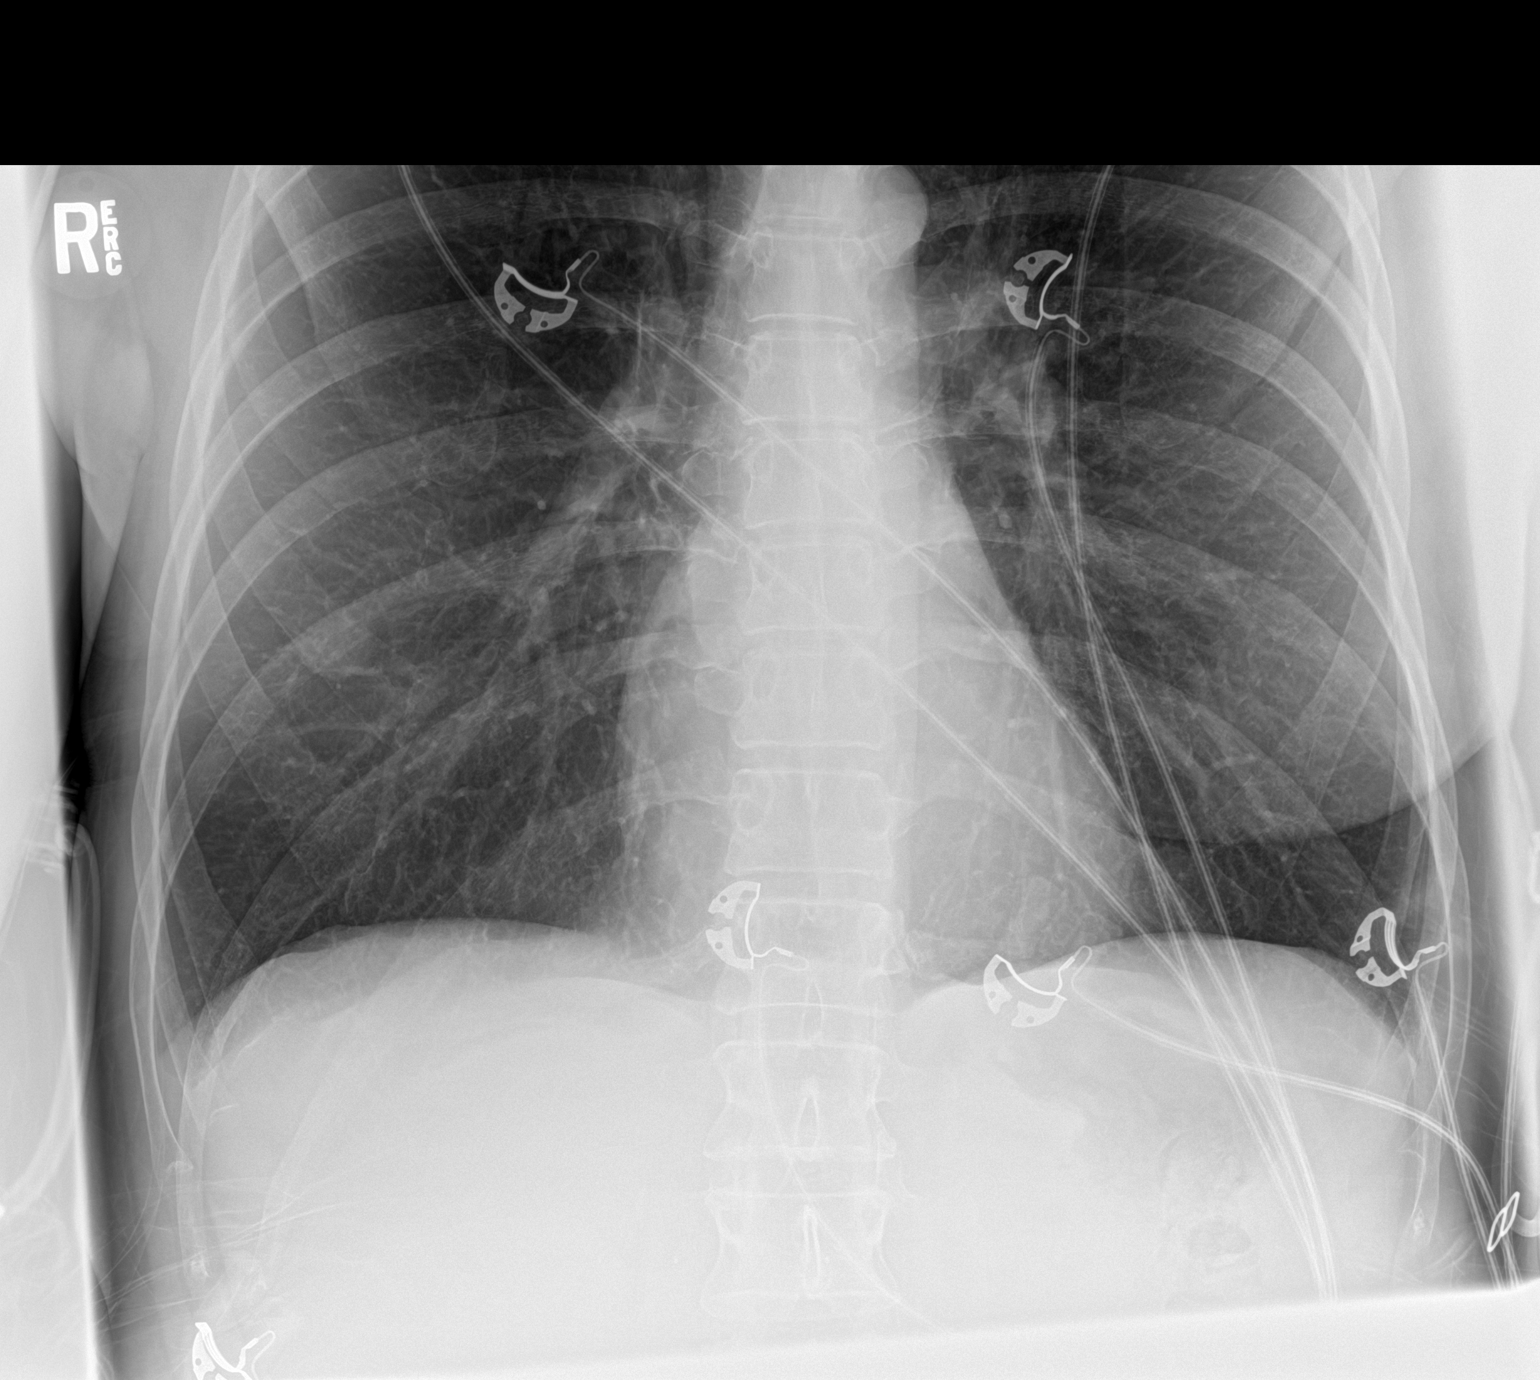

[2 of 2 positions shown; findings below may reference images not displayed]

FINDINGS: The heart size and mediastinal contours are within normal limits.
Both lungs are clear. The visualized skeletal structures are
unremarkable.
IMPRESSION: No active disease.

## 2019-12-22 ENCOUNTER — Other Ambulatory Visit: Payer: Self-pay | Admitting: Obstetrics and Gynecology

## 2019-12-22 ENCOUNTER — Encounter: Payer: Self-pay | Admitting: Obstetrics and Gynecology

## 2019-12-22 MED ORDER — SERTRALINE HCL 100 MG PO TABS: 100.0000 mg | ORAL_TABLET | Freq: Every day | ORAL | 0 refills | Status: DC

## 2019-12-22 NOTE — Progress Notes (Signed)
Rx RF sertraline until pt's annual.

## 2020-01-21 ENCOUNTER — Other Ambulatory Visit: Payer: Self-pay | Admitting: Physician Assistant

## 2020-02-06 ENCOUNTER — Other Ambulatory Visit: Payer: Self-pay | Admitting: Physician Assistant

## 2020-02-06 ENCOUNTER — Ambulatory Visit: Payer: 59 | Admitting: Physician Assistant

## 2020-02-06 ENCOUNTER — Encounter: Payer: Self-pay | Admitting: Physician Assistant

## 2020-02-06 ENCOUNTER — Other Ambulatory Visit: Payer: Self-pay

## 2020-02-06 VITALS — BP 103/69 | HR 71 | Wt 167.4 lb

## 2020-02-06 DIAGNOSIS — M62838 Other muscle spasm: Secondary | ICD-10-CM | POA: Diagnosis not present

## 2020-02-06 DIAGNOSIS — G43009 Migraine without aura, not intractable, without status migrainosus: Secondary | ICD-10-CM

## 2020-02-06 MED ORDER — SUMATRIPTAN SUCCINATE 100 MG PO TABS: 100.0000 mg | ORAL_TABLET | Freq: Once | ORAL | 11 refills | Status: DC | PRN

## 2020-02-06 MED ORDER — CYCLOBENZAPRINE HCL 10 MG PO TABS: 10.0000 mg | ORAL_TABLET | Freq: Three times a day (TID) | ORAL | 1 refills | Status: DC | PRN

## 2020-02-06 MED ORDER — NURTEC 75 MG PO TBDP: 75.0000 mg | ORAL_TABLET | Freq: Two times a day (BID) | ORAL | 11 refills | Status: DC | PRN

## 2020-02-06 MED ORDER — VENLAFAXINE HCL ER 37.5 MG PO CP24: 37.5000 mg | ORAL_CAPSULE | Freq: Two times a day (BID) | ORAL | 3 refills | Status: DC

## 2020-02-06 NOTE — Progress Notes (Signed)
History:  Megan Hernandez is a 49 y.o. [phone] who presents to clinic today for headache eval.  She has not been seen in over a year.  meds prescribed previously worked well.  She finally got her eyes checked and got new glasses.  This helped headaches tremendously.  She has an IUD to help her as she transitions with menopause.  She has few headaches when she has a period.  She started the IUD when she had heavy constant bleeding.  It helped.  She has dealt with mood issues during menopause.  She has used zoloft - was on 50mg  but has been on 100mg  for over a year.  Terrible divorce = 2 of her kids don't talk to her as a result. Pt forgot she had cyclobenzaprine and did not use   HIT6: 68 Number of days in the last 4 weeks with:  Severe headache: 8 Moderate headache: 3 Mild headache: 0  No headache: 17   Past Medical History:  Diagnosis Date  . Anxiety   . Depression   . 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 History of mammogram 11/12/213   birads 2 - benign  . History of Papanicolaou smear of cervix 07/14/2013   -/-  . Menstrual migraine   . SVT (supraventricular tachycardia) ([place])   . Vaginal lesion 02/01/2015    Social History   Socioeconomic History  . Marital status: Divorced    Spouse name: Not on file  . Number of children: Not on file  . Years of education: Not on file  . Highest education level: Not on file  Occupational History  . Not on file  Tobacco Use  . Smoking status: Never Smoker  . Smokeless tobacco: Never Used  Vaping Use  . Vaping Use: Never used  Substance and Sexual Activity  . Alcohol use: Yes    Comment: occ  . Drug use: No  . Sexual activity: Yes    Birth control/protection: I.U.D.    Comment: Mirena  Other Topics Concern  . Not on file  Social History Narrative  . Not on file   Social Determinants of Health   Financial Resource Strain:   . Difficulty of Paying Living Expenses:   Food Insecurity:   . Worried About Charity fundraiser  in the Last Year:   . Arboriculturist in the Last Year:   Transportation Needs:   . Film/video editor (Medical):   Marland Kitchen Lack of Transportation (Non-Medical):   Physical Activity:   . Days of Exercise per Week:   . Minutes of Exercise per Session:   Stress:   . Feeling of Stress :   Social Connections:   . Frequency of Communication with Friends and Family:   . Frequency of Social Gatherings with Friends and Family:   . Attends Religious Services:   . Active Member of Clubs or Organizations:   . Attends Archivist Meetings:   Marland Kitchen Marital Status:   Intimate Partner Violence:   . Fear of Current or Ex-Partner:   . Emotionally Abused:   Marland Kitchen Physically Abused:   . Sexually Abused:     Family History  Problem Relation Age of Onset  . Osteoporosis Mother   . Diabetes Father        Type 2  . Hypertension Father   . Depression Father   . Chronic bronchitis Father        copd  . Bipolar disorder Father  No Known Allergies  Current Outpatient Medications on File Prior to Visit  Medication Sig Dispense Refill  . ibuprofen (ADVIL) 200 MG tablet Take 600-800 mg by mouth every 8 (eight) hours as needed for fever (pain).    . metoprolol tartrate (LOPRESSOR) 25 MG tablet Take 1 tablet (25 mg total) by mouth 2 (two) times daily. 60 tablet 0  . Rhubarb (ESTROVEN MENOPAUSE RELIEF) 4 MG TABS Take 1 capsule by mouth daily.    . sertraline (ZOLOFT) 100 MG tablet Take 1 tablet (100 mg total) by mouth daily. 90 tablet 0  . SUMAtriptan (IMITREX) 100 MG tablet Take 1 tablet (100 mg total) by mouth once as needed for up to 1 dose for migraine. May repeat in 2 hours if headache persists or recurs. 9 tablet 11  . levonorgestrel (MIRENA, 52 MG,) 20 MCG/24HR IUD 1 Intra Uterine Device (1 each total) by Intrauterine route once. 1 Intra Uterine Device 0   No current facility-administered medications on file prior to visit.     Review of Systems:  All pertinent positive/negative included  in HPI, all other review of systems are negative   Objective:  Physical Exam BP 103/69   Pulse 71   Wt 167 lb 6.4 oz (75.9 kg)   BMI 27.86 kg/m  CONSTITUTIONAL: Well-developed, well-nourished female in no acute distress.  EYES: EOM intact ENT: Normocephalic CARDIOVASCULAR: Regular rate   RESPIRATORY: Normal rate.  MUSCULOSKELETAL: Normal ROM SKIN: Warm, dry without erythema  NEUROLOGICAL: Alert, oriented, CN II-XII grossly intact, Appropriate balance PSYCH: Normal behavior, mood   Assessment & Plan:  Assessment: 1. Migraine without aura and without status migrainosus, not intractable   2. Muscle spasm    worsening  Plan: IMitrex refilled.  Pt can also trial nurtec for prevention for fewer side effects and hopefully improved efficacy Cyclobenzaprine for HA or muscle spasm D/C metoprolol as pt was forgetting to use anyway Effexor for migraine prevention and hopefully will help depression/anxiety/menopausal symptoms.  She will titrate up to 75mg  and taper down and off zoloft.   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 Follow-up in 3 months or sooner PRN  , PA-C 02/06/2020 8:51 AM

## 2020-02-06 NOTE — Patient Instructions (Signed)

## 2020-02-10 ENCOUNTER — Encounter: Payer: Self-pay | Admitting: *Deleted

## 2020-03-01 ENCOUNTER — Telehealth: Payer: Self-pay | Admitting: Radiology

## 2020-03-01 NOTE — Telephone Encounter (Signed)
Left message for patient to call [facility] to schedule headache f/u in September.

## 2020-06-24 ENCOUNTER — Other Ambulatory Visit: Payer: Self-pay | Admitting: Physician Assistant

## 2020-07-01 ENCOUNTER — Other Ambulatory Visit: Payer: Self-pay | Admitting: *Deleted

## 2020-07-01 NOTE — Telephone Encounter (Signed)
erroneous

## 2020-07-29 ENCOUNTER — Encounter: Payer: Self-pay | Admitting: *Deleted

## 2020-09-10 ENCOUNTER — Other Ambulatory Visit: Payer: Self-pay

## 2020-09-10 ENCOUNTER — Ambulatory Visit: Payer: 59 | Admitting: Physician Assistant

## 2020-09-10 ENCOUNTER — Other Ambulatory Visit: Payer: Self-pay | Admitting: Physician Assistant

## 2020-09-10 ENCOUNTER — Encounter: Payer: Self-pay | Admitting: Physician Assistant

## 2020-09-10 VITALS — BP 116/77 | HR 106 | Wt 178.0 lb

## 2020-09-10 DIAGNOSIS — G43009 Migraine without aura, not intractable, without status migrainosus: Secondary | ICD-10-CM | POA: Diagnosis not present

## 2020-09-10 MED ORDER — EMGALITY 120 MG/ML ~~LOC~~ SOAJ: SUBCUTANEOUS | 11 refills | Status: DC

## 2020-09-10 MED ORDER — SUMATRIPTAN SUCCINATE 100 MG PO TABS: 100.0000 mg | ORAL_TABLET | Freq: Once | ORAL | 11 refills | Status: DC | PRN

## 2020-09-10 MED ORDER — VENLAFAXINE HCL ER 75 MG PO CP24: 75.0000 mg | ORAL_CAPSULE | Freq: Every day | ORAL | 11 refills | Status: DC

## 2020-09-10 NOTE — Patient Instructions (Signed)

## 2020-09-10 NOTE — Progress Notes (Signed)
History:  Megan Hernandez is a 50 y.o. [phone] who presents to clinic today for headache eval. Quality of individual headaches is unchanged. She used metoprolol for 3 months without improvement in migraine or anxiety.  She uses 75mg  of Effexor for depression/migraine prevention and feels it works well at this dose.  Nonetheless, she is having lots of headaches.   She wakes up with the headache already severe. Imitrex works but it takes 2 hours.  She notes she has too many migraine days to use the nasal spray or injection as she would not get enough doses of medicine.  She's gained weight with studying. She takes NCLEX on 2/5.  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  The hope is that she can get on a regular schedule and not have to work nights.    HIT6:64 Number of days in the last 4 weeks with:  Severe headache: 7    Moderate headache: 7 Mild headache: 4  No headache: 10   Past Medical History:  Diagnosis Date  . Anxiety   . Depression   . 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 History of mammogram 11/12/213   birads 2 - benign  . History of Papanicolaou smear of cervix 07/14/2013   -/-  . Menstrual migraine   . SVT (supraventricular tachycardia) ([facility])   . Vaginal lesion 02/01/2015    Social History   Socioeconomic History  . Marital status: Divorced    Spouse name: Not on file  . Number of children: Not on file  . Years of education: Not on file  . Highest education level: Not on file  Occupational History  . Not on file  Tobacco Use  . Smoking status: Never Smoker  . Smokeless tobacco: Never Used  Vaping Use  . Vaping Use: Never used  Substance and Sexual Activity  . Alcohol use: Yes    Comment: occ  . Drug use: No  . Sexual activity: Yes    Birth control/protection: I.U.D.    Comment: Mirena  Other Topics Concern  . Not on file  Social History Narrative  . Not on file   Social Determinants of Health   Financial  Resource Strain: Not on file  Food Insecurity: Not on file  Transportation Needs: Not on file  Physical Activity: Not on file  Stress: Not on file  Social Connections: Not on file  Intimate Partner Violence: Not on file    Family History  Problem Relation Age of Onset  . Osteoporosis Mother   . Diabetes Father        Type 2  . Hypertension Father   . Depression Father   . Chronic bronchitis Father        copd  . Bipolar disorder Father     No Known Allergies  Current Outpatient Medications on File Prior to Visit  Medication Sig Dispense Refill  . cyclobenzaprine (FLEXERIL) 10 MG tablet Take 1 tablet (10 mg total) by mouth every 8 (eight) hours as needed for muscle spasms. 60 tablet 1  . ibuprofen (ADVIL) 200 MG tablet Take 600-800 mg by mouth every 8 (eight) hours as needed for fever (pain).    . Rimegepant Sulfate (NURTEC) 75 MG TBDP Take 75 mg by mouth 3 times/day as needed-between meals & bedtime. 8 tablet 11  . SUMAtriptan (IMITREX) 100 MG tablet Take 1 tablet (100 mg total) by mouth once as needed for up to  1 dose for migraine. May repeat in 2 hours if headache persists or recurs. 9 tablet 11  . venlafaxine XR (EFFEXOR-XR) 37.5 MG 24 hr capsule TAKE 1 CAPSULE BY MOUTH 2 TIMES DAILY. 60 capsule 3  . levonorgestrel (MIRENA, 52 MG,) 20 MCG/24HR IUD 1 Intra Uterine Device (1 each total) by Intrauterine route once. 1 Intra Uterine Device 0  . metoprolol tartrate (LOPRESSOR) 25 MG tablet Take 1 tablet (25 mg total) by mouth 2 (two) times daily. (Patient not taking: Reported on 09/10/2020) 60 tablet 0  . Rhubarb (ESTROVEN MENOPAUSE RELIEF) 4 MG TABS Take 1 capsule by mouth daily. (Patient not taking: Reported on 09/10/2020)     No current facility-administered medications on file prior to visit.     Review of Systems:  All pertinent positive/negative included in HPI, all other review of systems are negative   Objective:  Physical Exam BP 116/77   Pulse (!) 106   Wt 178 lb  (80.7 kg)   BMI 29.62 kg/m  CONSTITUTIONAL: Well-developed, well-nourished female in no acute distress.  EYES: EOM intact ENT: Normocephalic CARDIOVASCULAR: Tachy at 106 RESPIRATORY: Normal rate.  MUSCULOSKELETAL: Normal ROM SKIN: Warm, dry without erythema  NEUROLOGICAL: Alert, oriented, CN II-XII grossly intact, Appropriate balance PSYCH: Normal behavior, mood   Assessment & Plan:  Assessment: Worsening of migraine  Plan: Continue Effexor 75mg  daily for migraine prevention Continue sumatriptan for acute migraine - with ibuprofen as needed. Begin Emgality - 2 injections for loading dose, followed by one monthly.  Sample provided.   Pt advised to begin seeing PCP.  Discuss weight loss with that provider as well as health maintenance.   Flexeril prn muscle spasm - difficult to use currently with schedule demands. Follow-up in 12 months or sooner PRN  09/10/2020 9:37 AM

## 2020-09-14 ENCOUNTER — Encounter: Payer: Self-pay | Admitting: *Deleted

## 2020-11-19 ENCOUNTER — Other Ambulatory Visit: Payer: Self-pay | Admitting: Physician Assistant

## 2020-11-19 ENCOUNTER — Other Ambulatory Visit: Payer: Self-pay

## 2020-11-19 MED ORDER — AMOXICILLIN-POT CLAVULANATE 875-125 MG PO TABS
1.0000 | ORAL_TABLET | Freq: Two times a day (BID) | ORAL | 0 refills | Status: DC
  Filled 2020-11-19: qty 20, 10d supply, fill #0

## 2020-11-19 MED FILL — Galcanezumab-gnlm Subcutaneous Soln Auto-Injector 120 MG/ML: SUBCUTANEOUS | 30 days supply | Qty: 1 | Fill #0 | Status: CN

## 2020-11-19 MED FILL — Venlafaxine HCl Cap ER 24HR 75 MG (Base Equivalent): ORAL | 30 days supply | Qty: 30 | Fill #0 | Status: AC

## 2020-11-20 ENCOUNTER — Other Ambulatory Visit: Payer: Self-pay

## 2020-11-22 ENCOUNTER — Other Ambulatory Visit: Payer: Self-pay

## 2020-12-07 ENCOUNTER — Other Ambulatory Visit: Payer: Self-pay

## 2020-12-07 MED FILL — Galcanezumab-gnlm Subcutaneous Soln Auto-Injector 120 MG/ML: SUBCUTANEOUS | 30 days supply | Qty: 1 | Fill #0 | Status: CN

## 2020-12-08 ENCOUNTER — Other Ambulatory Visit: Payer: Self-pay

## 2020-12-09 ENCOUNTER — Other Ambulatory Visit: Payer: Self-pay

## 2020-12-09 ENCOUNTER — Encounter: Payer: Self-pay | Admitting: *Deleted

## 2020-12-09 MED FILL — Galcanezumab-gnlm Subcutaneous Soln Auto-Injector 120 MG/ML: SUBCUTANEOUS | 28 days supply | Qty: 1 | Fill #0 | Status: AC

## 2020-12-29 ENCOUNTER — Other Ambulatory Visit: Payer: Self-pay

## 2020-12-29 MED FILL — Venlafaxine HCl Cap ER 24HR 75 MG (Base Equivalent): ORAL | 30 days supply | Qty: 30 | Fill #1 | Status: AC

## 2020-12-31 ENCOUNTER — Other Ambulatory Visit: Payer: Self-pay

## 2020-12-31 MED FILL — Galcanezumab-gnlm Subcutaneous Soln Auto-Injector 120 MG/ML: SUBCUTANEOUS | 28 days supply | Qty: 1 | Fill #1 | Status: CN

## 2021-01-03 ENCOUNTER — Other Ambulatory Visit: Payer: Self-pay

## 2021-01-04 ENCOUNTER — Other Ambulatory Visit: Payer: Self-pay

## 2021-01-05 MED FILL — Galcanezumab-gnlm Subcutaneous Soln Auto-Injector 120 MG/ML: SUBCUTANEOUS | 28 days supply | Qty: 1 | Fill #1 | Status: CN

## 2021-01-06 ENCOUNTER — Other Ambulatory Visit: Payer: Self-pay

## 2021-01-06 MED FILL — Galcanezumab-gnlm Subcutaneous Soln Auto-Injector 120 MG/ML: SUBCUTANEOUS | 28 days supply | Qty: 1 | Fill #1 | Status: CN

## 2021-01-11 ENCOUNTER — Other Ambulatory Visit: Payer: Self-pay

## 2021-01-11 MED FILL — Galcanezumab-gnlm Subcutaneous Soln Auto-Injector 120 MG/ML: SUBCUTANEOUS | 30 days supply | Qty: 1 | Fill #1 | Status: AC

## 2021-01-25 ENCOUNTER — Other Ambulatory Visit: Payer: Self-pay

## 2021-01-25 MED FILL — Galcanezumab-gnlm Subcutaneous Soln Auto-Injector 120 MG/ML: SUBCUTANEOUS | 30 days supply | Qty: 1 | Fill #2 | Status: CN

## 2021-02-08 ENCOUNTER — Other Ambulatory Visit: Payer: Self-pay

## 2021-02-08 MED FILL — Galcanezumab-gnlm Subcutaneous Soln Auto-Injector 120 MG/ML: SUBCUTANEOUS | 30 days supply | Qty: 1 | Fill #2 | Status: AC

## 2021-02-08 MED FILL — Venlafaxine HCl Cap ER 24HR 75 MG (Base Equivalent): ORAL | 30 days supply | Qty: 30 | Fill #2 | Status: AC

## 2021-03-11 ENCOUNTER — Other Ambulatory Visit: Payer: Self-pay

## 2021-03-11 MED FILL — Galcanezumab-gnlm Subcutaneous Soln Auto-Injector 120 MG/ML: SUBCUTANEOUS | 30 days supply | Qty: 1 | Fill #3 | Status: AC

## 2021-03-11 MED FILL — Venlafaxine HCl Cap ER 24HR 75 MG (Base Equivalent): ORAL | 30 days supply | Qty: 30 | Fill #3 | Status: AC

## 2021-04-12 ENCOUNTER — Other Ambulatory Visit: Payer: Self-pay

## 2021-04-12 MED FILL — Venlafaxine HCl Cap ER 24HR 75 MG (Base Equivalent): ORAL | 30 days supply | Qty: 30 | Fill #4 | Status: AC

## 2021-04-12 MED FILL — Galcanezumab-gnlm Subcutaneous Soln Auto-Injector 120 MG/ML: SUBCUTANEOUS | 30 days supply | Qty: 1 | Fill #4 | Status: AC

## 2021-05-16 MED FILL — Venlafaxine HCl Cap ER 24HR 75 MG (Base Equivalent): ORAL | 30 days supply | Qty: 30 | Fill #5 | Status: AC

## 2021-05-16 MED FILL — Galcanezumab-gnlm Subcutaneous Soln Auto-Injector 120 MG/ML: SUBCUTANEOUS | 30 days supply | Qty: 1 | Fill #5 | Status: AC

## 2021-05-17 ENCOUNTER — Other Ambulatory Visit: Payer: Self-pay

## 2021-06-13 ENCOUNTER — Other Ambulatory Visit: Payer: Self-pay

## 2021-06-13 MED FILL — Venlafaxine HCl Cap ER 24HR 75 MG (Base Equivalent): ORAL | 30 days supply | Qty: 30 | Fill #6 | Status: AC

## 2021-06-13 MED FILL — Galcanezumab-gnlm Subcutaneous Soln Auto-Injector 120 MG/ML: SUBCUTANEOUS | 30 days supply | Qty: 1 | Fill #6 | Status: CN

## 2021-06-14 ENCOUNTER — Other Ambulatory Visit: Payer: Self-pay

## 2021-06-15 ENCOUNTER — Other Ambulatory Visit: Payer: Self-pay | Admitting: *Deleted

## 2021-06-15 ENCOUNTER — Other Ambulatory Visit: Payer: Self-pay

## 2021-06-17 ENCOUNTER — Other Ambulatory Visit: Payer: Self-pay

## 2021-06-17 MED FILL — Galcanezumab-gnlm Subcutaneous Soln Auto-Injector 120 MG/ML: SUBCUTANEOUS | 30 days supply | Qty: 1 | Fill #6 | Status: CN

## 2021-06-20 ENCOUNTER — Encounter: Payer: Self-pay | Admitting: *Deleted

## 2021-06-22 ENCOUNTER — Other Ambulatory Visit: Payer: Self-pay

## 2021-06-22 MED FILL — Galcanezumab-gnlm Subcutaneous Soln Auto-Injector 120 MG/ML: SUBCUTANEOUS | 30 days supply | Qty: 1 | Fill #6 | Status: AC

## 2021-06-23 ENCOUNTER — Other Ambulatory Visit: Payer: Self-pay

## 2021-07-14 ENCOUNTER — Telehealth: Payer: Self-pay | Admitting: Radiology

## 2021-07-14 NOTE — Telephone Encounter (Signed)
Left message for patient to call [facility] to schedule Headache f/u for February

## 2021-07-22 ENCOUNTER — Other Ambulatory Visit: Payer: Self-pay

## 2021-07-22 MED FILL — Galcanezumab-gnlm Subcutaneous Soln Auto-Injector 120 MG/ML: SUBCUTANEOUS | 30 days supply | Qty: 1 | Fill #7 | Status: AC

## 2021-07-22 MED FILL — Venlafaxine HCl Cap ER 24HR 75 MG (Base Equivalent): ORAL | 30 days supply | Qty: 30 | Fill #7 | Status: AC

## 2021-08-24 ENCOUNTER — Other Ambulatory Visit: Payer: Self-pay

## 2021-08-24 MED FILL — Galcanezumab-gnlm Subcutaneous Soln Auto-Injector 120 MG/ML: SUBCUTANEOUS | 30 days supply | Qty: 1 | Fill #8 | Status: AC

## 2021-08-24 MED FILL — Venlafaxine HCl Cap ER 24HR 75 MG (Base Equivalent): ORAL | 30 days supply | Qty: 30 | Fill #8 | Status: AC

## 2021-09-26 ENCOUNTER — Other Ambulatory Visit: Payer: Self-pay | Admitting: *Deleted

## 2021-09-26 ENCOUNTER — Other Ambulatory Visit: Payer: Self-pay

## 2021-09-26 MED ORDER — EMGALITY 120 MG/ML ~~LOC~~ SOAJ
120.0000 mg | SUBCUTANEOUS | 4 refills | Status: DC
  Filled 2021-09-26: qty 1, 28d supply, fill #0
  Filled 2021-09-27 (×2): qty 1, 30d supply, fill #0
  Filled 2021-09-29: qty 1, 28d supply, fill #0
  Filled 2021-10-28: qty 1, 28d supply, fill #1
  Filled 2021-11-28: qty 1, 28d supply, fill #2

## 2021-09-27 ENCOUNTER — Other Ambulatory Visit: Payer: Self-pay

## 2021-09-28 ENCOUNTER — Encounter: Payer: Self-pay | Admitting: *Deleted

## 2021-09-29 ENCOUNTER — Other Ambulatory Visit: Payer: Self-pay

## 2021-09-30 ENCOUNTER — Other Ambulatory Visit: Payer: Self-pay

## 2021-10-28 ENCOUNTER — Other Ambulatory Visit: Payer: Self-pay

## 2021-10-28 ENCOUNTER — Other Ambulatory Visit: Payer: Self-pay | Admitting: Physician Assistant

## 2021-10-31 ENCOUNTER — Other Ambulatory Visit: Payer: Self-pay

## 2021-11-01 ENCOUNTER — Other Ambulatory Visit: Payer: Self-pay

## 2021-11-01 ENCOUNTER — Encounter: Payer: 59 | Admitting: Physician Assistant

## 2021-11-01 ENCOUNTER — Other Ambulatory Visit: Payer: Self-pay | Admitting: *Deleted

## 2021-11-01 MED ORDER — VENLAFAXINE HCL ER 75 MG PO CP24
ORAL_CAPSULE | ORAL | 11 refills | Status: DC
  Filled 2021-11-01: qty 30, 30d supply, fill #0

## 2021-11-01 MED ORDER — VENLAFAXINE HCL ER 75 MG PO CP24
ORAL_CAPSULE | ORAL | 11 refills | Status: DC
  Filled 2021-11-01: qty 30, 30d supply, fill #0
  Filled 2021-11-28: qty 30, 30d supply, fill #1

## 2021-11-29 ENCOUNTER — Other Ambulatory Visit: Payer: Self-pay

## 2021-12-19 ENCOUNTER — Other Ambulatory Visit: Payer: Self-pay

## 2021-12-22 ENCOUNTER — Other Ambulatory Visit: Payer: Self-pay

## 2021-12-22 MED ORDER — COVID-19 AT HOME ANTIGEN TEST VI KIT
PACK | 0 refills | Status: DC
  Filled 2021-12-22: qty 2, 4d supply, fill #0

## 2022-01-04 DIAGNOSIS — H524 Presbyopia: Secondary | ICD-10-CM | POA: Diagnosis not present

## 2022-01-06 ENCOUNTER — Encounter: Payer: Self-pay | Admitting: Physician Assistant

## 2022-01-06 ENCOUNTER — Ambulatory Visit: Payer: 59 | Admitting: Physician Assistant

## 2022-01-06 ENCOUNTER — Other Ambulatory Visit: Payer: Self-pay

## 2022-01-06 VITALS — BP 118/70 | HR 86 | Ht 65.0 in | Wt 152.4 lb

## 2022-01-06 DIAGNOSIS — G43009 Migraine without aura, not intractable, without status migrainosus: Secondary | ICD-10-CM | POA: Diagnosis not present

## 2022-01-06 MED ORDER — SUMATRIPTAN SUCCINATE 100 MG PO TABS
100.0000 mg | ORAL_TABLET | Freq: Once | ORAL | 11 refills | Status: DC | PRN
  Filled 2022-01-06: qty 9, 30d supply, fill #0
  Filled 2022-03-11: qty 9, 30d supply, fill #1
  Filled 2022-05-10: qty 9, 30d supply, fill #2
  Filled 2022-06-12: qty 9, 30d supply, fill #3

## 2022-01-06 MED ORDER — EMGALITY 120 MG/ML ~~LOC~~ SOAJ
120.0000 mg | SUBCUTANEOUS | 11 refills | Status: DC
  Filled 2022-01-06: qty 1, 30d supply, fill #0
  Filled 2022-01-24 – 2022-03-13 (×4): qty 1, 28d supply, fill #0
  Filled 2022-03-15: qty 1, 30d supply, fill #0
  Filled 2022-04-09: qty 1, 30d supply, fill #1
  Filled 2022-05-10: qty 1, 30d supply, fill #2
  Filled 2022-06-12: qty 1, 30d supply, fill #3
  Filled 2022-07-17: qty 1, 30d supply, fill #4
  Filled 2022-08-13: qty 1, 30d supply, fill #5
  Filled 2022-09-12 – 2022-09-18 (×4): qty 1, 30d supply, fill #6
  Filled 2022-10-16: qty 1, 30d supply, fill #7
  Filled 2022-11-15: qty 1, 30d supply, fill #8
  Filled 2022-12-12: qty 1, 30d supply, fill #9

## 2022-01-06 MED ORDER — VENLAFAXINE HCL ER 75 MG PO CP24
75.0000 mg | ORAL_CAPSULE | Freq: Every day | ORAL | 11 refills | Status: DC
  Filled 2022-01-06: qty 30, 30d supply, fill #0
  Filled 2022-03-11: qty 30, 30d supply, fill #1
  Filled 2022-04-09: qty 30, 30d supply, fill #2
  Filled 2022-05-10: qty 30, 30d supply, fill #3
  Filled 2022-06-12: qty 30, 30d supply, fill #4
  Filled 2022-07-17: qty 30, 30d supply, fill #5
  Filled 2022-08-13: qty 30, 30d supply, fill #6
  Filled 2022-09-12: qty 30, 30d supply, fill #7
  Filled 2022-11-15: qty 30, 30d supply, fill #8

## 2022-01-06 NOTE — Progress Notes (Signed)
History:  Megan Hernandez is a 51 y.o. [phone] who presents to clinic today for annual headache visit.  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  This change has been so good for her.  Her headache numbers are much better.   She is using Emgality and it has also been a Higher education careers adviser.  It's working great.  Sumatriptan has worked well for acute headache.   She is boxing and hiking for exercise. She has lost nearly 30# and is feeling so much better.   Number of days in the last 4 weeks with:  Severe headache: 2 Moderate headache: 0 Mild headache: 1  No headache: 25   Past Medical History:  Diagnosis Date   Anxiety    Depression    Herpes 01/2015   type 2 on cx   History of mammogram 11/12/213   birads 2 - benign   History of Papanicolaou smear of cervix 07/14/2013   -/-   Menstrual migraine    SVT (supraventricular tachycardia) ([facility])    Vaginal lesion 02/01/2015    Social History   Socioeconomic History   Marital status: Divorced    Spouse name: Not on file   Number of children: Not on file   Years of education: Not on file   Highest education level: Not on file  Occupational History   Not on file  Tobacco Use   Smoking status: Never   Smokeless tobacco: Never  Vaping Use   Vaping Use: Never used  Substance and Sexual Activity   Alcohol use: Yes    Comment: occ   Drug use: No   Sexual activity: Yes    Birth control/protection: I.U.D.    Comment: Mirena  Other Topics Concern   Not on file  Social History Narrative   Not on file   Social Determinants of Health   Financial Resource Strain: Not on file  Food Insecurity: Not on file  Transportation Needs: Not on file  Physical Activity: Not on file  Stress: Not on file  Social Connections: Not on file  Intimate Partner Violence: Not on file    Family History  Problem Relation Age of Onset   Osteoporosis Mother    Diabetes Father        Type 2   Hypertension Father    Depression  Father    Chronic bronchitis Father        copd   Bipolar disorder Father     No Known Allergies  Current Outpatient Medications on File Prior to Visit  Medication Sig Dispense Refill   Galcanezumab-gnlm (EMGALITY) 120 MG/ML SOAJ Inject 120 mg into the skin every 30 (thirty) days. Inj $RemoveB'240mg'[id number]$  once then $RemoveB'120mg'UnbOjZaQ$  monthly 1 mL 4   SUMAtriptan (IMITREX) 100 MG tablet Take 1 tablet (100 mg total) by mouth once as needed for up to 1 dose for migraine. May repeat in 2 hours if headache persists or recurs. 9 tablet 11   venlafaxine XR (EFFEXOR-XR) 75 MG 24 hr capsule Take 1 capsule (75 mg total) by mouth daily. 30 capsule 11   amoxicillin-clavulanate (AUGMENTIN) 875-125 MG tablet Take 1 tablet by mouth 2 (two) times daily. (Patient not taking: Reported on 01/06/2022) 20 tablet 0   COVID-19 At Home Antigen Test (CARESTART COVID-19 HOME TEST) KIT use as directed (Patient not taking: Reported on 01/06/2022) 2 kit 0   cyclobenzaprine (FLEXERIL) 10 MG tablet Take 1 tablet (10 mg total) by mouth every 8 (eight) hours  as needed for muscle spasms. (Patient not taking: Reported on 01/06/2022) 60 tablet 1   ibuprofen (ADVIL) 200 MG tablet Take 600-800 mg by mouth every 8 (eight) hours as needed for fever (pain). (Patient not taking: Reported on 01/06/2022)     levonorgestrel (MIRENA, 52 MG,) 20 MCG/24HR IUD 1 Intra Uterine Device (1 each total) by Intrauterine route once. 1 Intra Uterine Device 0   metoprolol tartrate (LOPRESSOR) 25 MG tablet Take 1 tablet (25 mg total) by mouth 2 (two) times daily. (Patient not taking: Reported on 09/10/2020) 60 tablet 0   Rhubarb (ESTROVEN MENOPAUSE RELIEF) 4 MG TABS Take 1 capsule by mouth daily. (Patient not taking: Reported on 09/10/2020)     SUMAtriptan (IMITREX) 100 MG tablet TAKE 1 TABLET BY MOUTH ONCE AS NEEDED FOR UP TO 1 DOSE FOR MIGRAINE. MAY REPEAT IN 2 HOURS IF HEADACHE PERSISTS OR RECURS. 9 tablet 11   SUMAtriptan (IMITREX) 100 MG tablet TAKE 1 TABLET BY MOUTH ONCE AS NEEDED  FOR UP TO 1 DOSE FOR MIGRAINE. MAY REPEAT IN 2 HOURS IF HEADACHE PERSISTS OR RECURS. 9 tablet 11   venlafaxine XR (EFFEXOR-XR) 75 MG 24 hr capsule TAKE 1 CAPSULE (75 MG TOTAL) BY MOUTH DAILY. (Patient not taking: Reported on 01/06/2022) 30 capsule 11   No current facility-administered medications on file prior to visit.     Review of Systems:  All pertinent positive/negative included in HPI, all other review of systems are negative   Objective:  Physical Exam BP 118/70   Pulse 86   Ht $R'5\' 5"'MV$  (1.651 m)   Wt 152 lb 6.4 oz (69.1 kg)   BMI 25.36 kg/m  CONSTITUTIONAL: Well-developed, well-nourished female in no acute distress.  EYES: EOM intact ENT: Normocephalic CARDIOVASCULAR: Regular rate   RESPIRATORY: Normal rate.  MUSCULOSKELETAL: Normal ROM SKIN: Warm, dry without erythema  NEUROLOGICAL: Alert, oriented, CN II-XII grossly intact, Appropriate balance PSYCH: Normal behavior, mood   Assessment & Plan:  Assessment: 1. Migraine without aura and without status migrainosus, not intractable      Plan: Pt applauded for all the positive changes that have served her so well! Continue effexor daily and emgality monthly for headache prevention.  Continue imitrex for acute HA.   Continue exercise and regular routine. Pt plans to see PCP to discuss recent syncopal episode on 6/5.   Follow-up in 12 months or sooner PRN  Paticia Stack, PA-C 01/06/2022 11:19 AM

## 2022-01-24 ENCOUNTER — Other Ambulatory Visit: Payer: Self-pay

## 2022-03-05 ENCOUNTER — Encounter: Payer: Self-pay | Admitting: Internal Medicine

## 2022-03-06 ENCOUNTER — Ambulatory Visit: Payer: 59 | Admitting: Family

## 2022-03-09 ENCOUNTER — Other Ambulatory Visit (HOSPITAL_COMMUNITY): Payer: Self-pay

## 2022-03-10 ENCOUNTER — Other Ambulatory Visit: Payer: Self-pay | Admitting: Internal Medicine

## 2022-03-10 ENCOUNTER — Encounter: Payer: Self-pay | Admitting: Internal Medicine

## 2022-03-10 ENCOUNTER — Ambulatory Visit: Payer: 59 | Admitting: Internal Medicine

## 2022-03-10 VITALS — BP 104/62 | HR 90 | Ht 65.0 in | Wt 151.0 lb

## 2022-03-10 DIAGNOSIS — Z1231 Encounter for screening mammogram for malignant neoplasm of breast: Secondary | ICD-10-CM | POA: Diagnosis not present

## 2022-03-10 DIAGNOSIS — G43009 Migraine without aura, not intractable, without status migrainosus: Secondary | ICD-10-CM

## 2022-03-10 DIAGNOSIS — I471 Supraventricular tachycardia: Secondary | ICD-10-CM | POA: Insufficient documentation

## 2022-03-10 DIAGNOSIS — F411 Generalized anxiety disorder: Secondary | ICD-10-CM

## 2022-03-10 NOTE — Progress Notes (Signed)
Date:  03/10/2022   Name:  Megan Hernandez   DOB:  11-Aug-1971   MRN:  [id number]   Chief Complaint: New Patient (Initial Visit) and Loss of Consciousness (This happened back in April 2023. Passed out for about 10 secs with son present. Stood up normally/ did not hit head. Never happened before. Patient has hx of SVTs. Has never seen Cardiology. 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 )  Loss of Consciousness Chronicity: once in April this year. Associated symptoms include headaches and palpitations. Pertinent negatives include no chest pain, diaphoresis, dizziness or light-headedness. SVT several times in the past  Migraine  This is a recurrent problem. The current episode started more than 1 year ago. The problem occurs intermittently. The problem has been gradually improving. The pain does not radiate. Pertinent negatives include no coughing or dizziness. Treatments tried: Emgality started last year. Her past medical history is significant for migraine headaches.    Lab Results  Component Value Date   NA 137 01/06/2018   K 3.7 01/06/2018   CO2 19 (L) 01/06/2018   GLUCOSE 113 (H) 01/06/2018   BUN 21 (H) 01/06/2018   CREATININE 0.93 01/06/2018   CALCIUM 8.9 01/06/2018   GFRNONAA >60 01/06/2018   No results found for: "CHOL", "HDL", "LDLCALC", "LDLDIRECT", "TRIG", "CHOLHDL" No results found for: "TSH" No results found for: "HGBA1C" Lab Results  Component Value Date   WBC 10.5 01/06/2018   HGB 14.8 01/06/2018   HCT 41.3 01/06/2018   MCV 91.1 01/06/2018   PLT 296 01/06/2018   No results found for: "ALT", "AST", "GGT", "ALKPHOS", "BILITOT" No results found for: "25OHVITD2", "25OHVITD3", "VD25OH"   Review of Systems  Constitutional:  Negative for appetite change, diaphoresis, fatigue and unexpected weight change.  Respiratory:  Negative for cough, chest tightness, shortness of breath and wheezing.   Cardiovascular:  Positive for palpitations  and syncope. Negative for chest pain and leg swelling.  Neurological:  Positive for headaches. Negative for dizziness and light-headedness.  Psychiatric/Behavioral:  Negative for dysphoric mood and sleep disturbance. The patient is not nervous/anxious.     Patient Active Problem List   Diagnosis Date Noted   Migraine without aura and without status migrainosus, not intractable 11/11/2018   Generalized anxiety disorder 11/11/2018   Muscle spasm 11/11/2018    No Known Allergies  Past Surgical History:  Procedure Laterality Date   INCISION AND DRAINAGE PERIRECTAL ABSCESS Right 04/15/2019   Procedure: IRRIGATION AND DEBRIDEMENT PERIRECTAL ABSCESS;  Surgeon: Leafy Ro, MD;  Location: [facility] ORS;  Service: General;  Laterality: Right;   KNEE SURGERY  as a teen   As a teen    Social History   Tobacco Use   Smoking status: Former    Packs/day: 0.50    Years: 7.00    Total pack years: 3.50    Types: Cigarettes    Quit date: 2018    Years since quitting: 5.5   Smokeless tobacco: Never  Vaping Use   Vaping Use: Some days   Substances: Nicotine, Flavoring  Substance Use Topics   Alcohol use: Yes    Comment: occ   Drug use: No     Medication list has been reviewed and updated.  No outpatient medications have been marked as taking for the 03/10/22 encounter (Office Visit) with Reubin Milan, MD.       03/10/2022    2:41 PM  GAD 7 : Generalized  Anxiety Score  Nervous, Anxious, on Edge 1  Control/stop worrying 0  Worry too much - different things 0  Trouble relaxing 0  Restless 0  Easily annoyed or irritable 0  Afraid - awful might happen 0  Total GAD 7 Score 1  Anxiety Difficulty Not difficult at all       03/10/2022    2:41 PM  Depression screen PHQ 2/9  Decreased Interest 0  Down, Depressed, Hopeless 0  PHQ - 2 Score 0  Altered sleeping 0  Tired, decreased energy 0  Change in appetite 0  Feeling bad or failure about yourself  0  Trouble concentrating 0   Moving slowly or fidgety/restless 0  Suicidal thoughts 0  PHQ-9 Score 0  Difficult doing work/chores Not difficult at all    BP Readings from Last 3 Encounters:  03/10/22 104/62  01/06/22 118/70  09/10/20 116/77    Physical Exam Vitals and nursing note reviewed.  Constitutional:      General: She is not in acute distress.    Appearance: Normal appearance. She is well-developed.  HENT:     Head: Normocephalic and atraumatic.  Neck:     Vascular: No carotid bruit.  Cardiovascular:     Rate and Rhythm: Normal rate and regular rhythm.     Pulses: Normal pulses.     Heart sounds: No murmur heard. Pulmonary:     Effort: Pulmonary effort is normal. No respiratory distress.     Breath sounds: No wheezing or rhonchi.  Abdominal:     General: Abdomen is flat.     Palpations: Abdomen is soft.     Tenderness: There is no abdominal tenderness.  Musculoskeletal:     Cervical back: Normal range of motion.     Right lower leg: No edema.     Left lower leg: No edema.  Lymphadenopathy:     Cervical: No cervical adenopathy.  Skin:    General: Skin is warm and dry.     Findings: No rash.  Neurological:     Mental Status: She is alert and oriented to person, place, and time.  Psychiatric:        Mood and Affect: Mood normal.        Behavior: Behavior normal.     Wt Readings from Last 3 Encounters:  03/10/22 151 lb (68.5 kg)  01/06/22 152 lb 6.4 oz (69.1 kg)  09/10/20 178 lb (80.7 kg)    BP 104/62   Pulse 90   Ht 5\' 5"  (1.651 m)   Wt 151 lb (68.5 kg)   SpO2 96%   BMI 25.13 kg/m   Assessment and Plan: 1. SVT (supraventricular tachycardia) ([facility]) Recommend Cardiology consultation Will get screening labs - CBC with Differential/Platelet - Comprehensive metabolic panel - Lipid panel - Ambulatory referral to Cardiology  2. Migraine without aura and without status migrainosus, not intractable Doing much better on Emgality with Imitrex prn  3. Generalized anxiety  disorder Started after a divorce several years ago On Effexor and doing well. - TSH  4. Encounter for screening mammogram for breast cancer Schedule at [facility] - MM 3D SCREEN BREAST BILATERAL   Partially dictated using [facility]. Any errors are unintentional.  Animal nutritionist, MD [facility]  03/10/2022

## 2022-03-11 LAB — CBC WITH DIFFERENTIAL/PLATELET
Basophils Absolute: 0 10*3/uL (ref 0.0–0.2)
Basos: 1 %
EOS (ABSOLUTE): 0.1 10*3/uL (ref 0.0–0.4)
Eos: 2 %
Hematocrit: 38.3 % (ref 34.0–46.6)
Hemoglobin: 13.1 g/dL (ref 11.1–15.9)
Immature Grans (Abs): 0 10*3/uL (ref 0.0–0.1)
Immature Granulocytes: 0 %
Lymphocytes Absolute: 1.3 10*3/uL (ref 0.7–3.1)
Lymphs: 30 %
MCH: 31 pg (ref 26.6–33.0)
MCHC: 34.2 g/dL (ref 31.5–35.7)
MCV: 91 fL (ref 79–97)
Monocytes Absolute: 0.6 10*3/uL (ref 0.1–0.9)
Monocytes: 14 %
Neutrophils Absolute: 2.4 10*3/uL (ref 1.4–7.0)
Neutrophils: 53 %
Platelets: 284 10*3/uL (ref 150–450)
RBC: 4.23 x10E6/uL (ref 3.77–5.28)
RDW: 12.7 % (ref 11.7–15.4)
WBC: 4.4 10*3/uL (ref 3.4–10.8)

## 2022-03-11 LAB — LIPID PANEL
Chol/HDL Ratio: 3.3 ratio (ref 0.0–4.4)
Cholesterol, Total: 195 mg/dL (ref 100–199)
HDL: 60 mg/dL (ref >39)
LDL Chol Calc (NIH): 120 mg/dL — ABNORMAL HIGH (ref 0–99)
Triglycerides: 84 mg/dL (ref 0–149)
VLDL Cholesterol Cal: 15 mg/dL (ref 5–40)

## 2022-03-11 LAB — COMPREHENSIVE METABOLIC PANEL
ALT: 12 IU/L (ref 0–32)
AST: 17 IU/L (ref 0–40)
Albumin/Globulin Ratio: 1.8 (ref 1.2–2.2)
Albumin: 4.6 g/dL (ref 3.8–4.9)
Alkaline Phosphatase: 20 IU/L — ABNORMAL LOW (ref 44–121)
BUN/Creatinine Ratio: 22 (ref 9–23)
BUN: 21 mg/dL (ref 6–24)
Bilirubin Total: 0.5 mg/dL (ref 0.0–1.2)
CO2: 24 mmol/L (ref 20–29)
Calcium: 8.9 mg/dL (ref 8.7–10.2)
Chloride: 99 mmol/L (ref 96–106)
Creatinine, Ser: 0.95 mg/dL (ref 0.57–1.00)
Globulin, Total: 2.5 g/dL (ref 1.5–4.5)
Glucose: 84 mg/dL (ref 70–99)
Potassium: 4.2 mmol/L (ref 3.5–5.2)
Sodium: 138 mmol/L (ref 134–144)
Total Protein: 7.1 g/dL (ref 6.0–8.5)
eGFR: 73 mL/min/{1.73_m2} (ref >59)

## 2022-03-11 LAB — TSH: TSH: 1.21 u[IU]/mL (ref 0.450–4.500)

## 2022-03-12 ENCOUNTER — Other Ambulatory Visit: Payer: Self-pay

## 2022-03-12 ENCOUNTER — Encounter: Payer: Self-pay | Admitting: Internal Medicine

## 2022-03-12 DIAGNOSIS — E785 Hyperlipidemia, unspecified: Secondary | ICD-10-CM | POA: Insufficient documentation

## 2022-03-13 ENCOUNTER — Encounter: Payer: Self-pay | Admitting: Internal Medicine

## 2022-03-13 ENCOUNTER — Other Ambulatory Visit: Payer: Self-pay

## 2022-03-15 ENCOUNTER — Other Ambulatory Visit: Payer: Self-pay

## 2022-03-15 ENCOUNTER — Telehealth: Payer: Self-pay

## 2022-03-15 MED ORDER — PREDNISONE 10 MG PO TABS
ORAL_TABLET | ORAL | 0 refills | Status: DC
  Filled 2022-03-15: qty 10, 4d supply, fill #0

## 2022-03-15 NOTE — Telephone Encounter (Signed)
Called pt left VM as a reminder to call and schedule mammogram.  KP

## 2022-03-17 ENCOUNTER — Encounter: Payer: Self-pay | Admitting: *Deleted

## 2022-03-17 ENCOUNTER — Other Ambulatory Visit: Payer: Self-pay

## 2022-04-09 ENCOUNTER — Other Ambulatory Visit: Payer: Self-pay

## 2022-04-10 ENCOUNTER — Other Ambulatory Visit: Payer: Self-pay

## 2022-05-11 ENCOUNTER — Other Ambulatory Visit: Payer: Self-pay

## 2022-05-18 ENCOUNTER — Telehealth: Payer: 59 | Admitting: Family Medicine

## 2022-05-18 DIAGNOSIS — B9789 Other viral agents as the cause of diseases classified elsewhere: Secondary | ICD-10-CM | POA: Diagnosis not present

## 2022-05-18 DIAGNOSIS — J019 Acute sinusitis, unspecified: Secondary | ICD-10-CM

## 2022-05-18 DIAGNOSIS — J029 Acute pharyngitis, unspecified: Secondary | ICD-10-CM | POA: Diagnosis not present

## 2022-05-18 MED ORDER — LIDOCAINE VISCOUS HCL 2 % MT SOLN: 15.0000 mL | OROMUCOSAL | 0 refills | Status: DC | PRN

## 2022-05-18 MED ORDER — IPRATROPIUM BROMIDE 0.03 % NA SOLN: 2.0000 | Freq: Three times a day (TID) | NASAL | 0 refills | Status: DC | PRN

## 2022-05-18 NOTE — Progress Notes (Signed)
E-Visit for Sinus Problems  We are sorry that you are not feeling well.  Here is how we plan to help!  Based on what you have shared with me it looks like you have sinusitis.  Sinusitis is inflammation and infection in the sinus cavities of the head.  Based on your presentation I believe you most likely have Acute Viral Sinusitis.This is an infection most likely caused by a virus. There is not specific treatment for viral sinusitis other than to help you with the symptoms until the infection runs its course.  You may use an oral decongestant such as Mucinex D or if you have glaucoma or high blood pressure use plain Mucinex. Saline nasal spray help and can safely be used as often as needed for congestion, I have prescribed: Ipratropium Bromide nasal spray 0.03% 2 sprays in eah nostril 2-3 times a day You can use in addition to Flonase, or can pick up Astelin nasal spray and use with Flonase.  It can take several days up to 5-7 for OTC to work for viral symptoms.  I will also send in lidocaine swish for the throat pain.   Some authorities believe that zinc sprays or the use of Echinacea may shorten the course of your symptoms.  Sinus infections are not as easily transmitted as other respiratory infection, however we still recommend that you avoid close contact with loved ones, especially the very young and elderly.  Remember to wash your hands thoroughly throughout the day as this is the number one way to prevent the spread of infection!  Home Care: Only take medications as instructed by your medical team. Do not take these medications with alcohol. A steam or ultrasonic humidifier can help congestion.  You can place a towel over your head and breathe in the steam from hot water coming from a faucet. Avoid close contacts especially the very young and the elderly. Cover your mouth when you cough or sneeze. Always remember to wash your hands.  Get Help Right Away If: You develop worsening fever  or sinus pain. You develop a severe head ache or visual changes. Your symptoms persist after you have completed your treatment plan.  Make sure you Understand these instructions. Will watch your condition. Will get help right away if you are not doing well or get worse.   Thank you for choosing an e-visit.  Your e-visit answers were reviewed by a board certified advanced clinical practitioner to complete your personal care plan. Depending upon the condition, your plan could have included both over the counter or prescription medications.  Please review your pharmacy choice. Make sure the pharmacy is open so you can pick up prescription now. If there is a problem, you may contact your provider through [employer] and have the prescription routed to another pharmacy.  Your safety is important to [place]. If you have drug allergies check your prescription carefully.   For the next 24 hours you can use MyChart to ask questions about today's visit, request a non-urgent call back, or ask for a work or school excuse. You will get an email in the next two days asking about your experience. I hope that your e-visit has been valuable and will speed your recovery.   I provided 5 minutes of non face-to-face time during this encounter for chart review, medication and order placement, as well as and documentation.

## 2022-05-22 ENCOUNTER — Encounter: Payer: Self-pay | Admitting: Internal Medicine

## 2022-05-22 ENCOUNTER — Other Ambulatory Visit: Payer: Self-pay

## 2022-05-22 ENCOUNTER — Telehealth: Payer: 59 | Admitting: Nurse Practitioner

## 2022-05-22 DIAGNOSIS — R051 Acute cough: Secondary | ICD-10-CM | POA: Diagnosis not present

## 2022-05-22 DIAGNOSIS — J0140 Acute pansinusitis, unspecified: Secondary | ICD-10-CM | POA: Diagnosis not present

## 2022-05-22 MED ORDER — DOXYCYCLINE HYCLATE 100 MG PO TABS
100.0000 mg | ORAL_TABLET | Freq: Two times a day (BID) | ORAL | 0 refills | Status: AC
  Filled 2022-05-22: qty 20, 10d supply, fill #0

## 2022-05-22 MED ORDER — BENZONATATE 100 MG PO CAPS
100.0000 mg | ORAL_CAPSULE | Freq: Three times a day (TID) | ORAL | 0 refills | Status: DC | PRN
  Filled 2022-05-22: qty 30, 10d supply, fill #0

## 2022-05-22 NOTE — Progress Notes (Signed)
Virtual Visit Consent   [name], you are scheduled for a virtual visit with a [facility] provider today. Just as with appointments in the office, your consent must be obtained to participate. Your consent will be active for this visit and any virtual visit you may have with one of our providers in the next 365 days. If you have a MyChart account, a copy of this consent can be sent to you electronically.  As this is a virtual visit, video technology does not allow for your provider to perform a traditional examination. This may limit your provider's ability to fully assess your condition. If your provider identifies any concerns that need to be evaluated in person or the need to arrange testing (such as labs, EKG, etc.), we will make arrangements to do so. Although advances in technology are sophisticated, we cannot ensure that it will always work on either your end or our end. If the connection with a video visit is poor, the visit may have to be switched to a telephone visit. With either a video or telephone visit, we are not always able to ensure that we have a secure connection.  By engaging in this virtual visit, you consent to the provision of healthcare and authorize for your insurance to be billed (if applicable) for the services provided during this visit. Depending on your insurance coverage, you may receive a charge related to this service.  I need to obtain your verbal consent now. Are you willing to proceed with your visit today? [name] has provided verbal consent on 05/22/2022 for a virtual visit (video or telephone). Apolonio Schneiders, FNP  Date: 05/22/2022 7:45 AM  Virtual Visit via Video Note   I, Apolonio Schneiders, connected with  Megan Hernandez  ([id number], 31-Jul-1971) on 05/22/22 at  8:00 AM EDT by a video-enabled telemedicine application and verified that I am speaking with the correct person using two identifiers.  Location: Patient: Virtual Visit Location Patient:  Home Provider: Virtual Visit Location Provider: Home Office   I discussed the limitations of evaluation and management by telemedicine and the availability of in person appointments. The patient expressed understanding and agreed to proceed.    History of Present Illness: Megan Hernandez is a 51 y.o. who identifies as a female who was assigned female at birth, and is being seen today with complaints of ongoing head congestion for the past week.  She was COVID tested last Monday  Had an Evisit on day 4 and was given recommendations for viral URI/sinusitis.   She has been using ibuprofen, sore throat lozenges, and Mucinex   Today he throat is very sore to swallow, she has a cough, ongoing nasal congestion overall just feeling worse day 8 Unsure of fever because she is staying on ibuprofen around the clock.     Problems:  Patient Active Problem List   Diagnosis Date Noted   Mild hyperlipidemia 03/12/2022   SVT (supraventricular tachycardia) 03/10/2022   Migraine without aura and without status migrainosus, not intractable 11/11/2018   Generalized anxiety disorder 11/11/2018   Muscle spasm 11/11/2018    Allergies: No Known Allergies Medications:  Current Outpatient Medications:    Galcanezumab-gnlm (EMGALITY) 120 MG/ML SOAJ, Inject 120 mg into the skin every 30 (thirty) days. Inj 240mg  once then 120mg  monthly, Disp: 1 mL, Rfl: 11   ipratropium (ATROVENT) 0.03 % nasal spray, Place 2 sprays into both nostrils 3 (three) times daily as needed for rhinitis., Disp: 30 mL, Rfl: 0  levonorgestrel (MIRENA, 52 MG,) 20 MCG/24HR IUD, 1 Intra Uterine Device (1 each total) by Intrauterine route once., Disp: 1 Intra Uterine Device, Rfl: 0   lidocaine (XYLOCAINE) 2 % solution, Use as directed 15 mLs in the mouth or throat as needed for mouth pain., Disp: 100 mL, Rfl: 0   predniSONE (DELTASONE) 10 MG tablet, Use 4 tabs on day 1, Use 3 tabs day 2, Use 2 tabs on day 3 and Use 1 tab on day 4., Disp: 10  tablet, Rfl: 0   SUMAtriptan (IMITREX) 100 MG tablet, Take 1 tablet (100 mg total) by mouth once as needed for up to 1 dose for migraine. May repeat in 2 hours if headache persists or recurs., Disp: 9 tablet, Rfl: 11   venlafaxine XR (EFFEXOR-XR) 75 MG 24 hr capsule, Take 1 capsule (75 mg total) by mouth daily with breakfast., Disp: 30 capsule, Rfl: 11  Observations/Objective: Patient is well-developed, well-nourished in no acute distress.  Resting comfortably  at home.  Head is normocephalic, atraumatic.  No labored breathing.  Speech is clear and coherent with logical content.  Patient is alert and oriented at baseline.    Assessment and Plan: 1. Acute non-recurrent pansinusitis Meds ordered this encounter  Medications   doxycycline (VIBRA-TABS) 100 MG tablet    Sig: Take 1 tablet (100 mg total) by mouth 2 (two) times daily for 10 days.    Dispense:  20 tablet    Refill:  0   benzonatate (TESSALON) 100 MG capsule    Sig: Take 1 capsule (100 mg total) by mouth 3 (three) times daily as needed.    Dispense:  30 capsule    Refill:  0        Follow Up Instructions: I discussed the assessment and treatment plan with the patient. The patient was provided an opportunity to ask questions and all were answered. The patient agreed with the plan and demonstrated an understanding of the instructions.  A copy of instructions were sent to the patient via MyChart unless otherwise noted below.    The patient was advised to call back or seek an in-person evaluation if the symptoms worsen or if the condition fails to improve as anticipated.  Time:  I spent 15 minutes with the patient via telehealth technology discussing the above problems/concerns.    Viviano Simas, FNP

## 2022-05-29 ENCOUNTER — Ambulatory Visit: Payer: Self-pay | Admitting: Internal Medicine

## 2022-06-12 ENCOUNTER — Other Ambulatory Visit: Payer: Self-pay

## 2022-07-17 ENCOUNTER — Other Ambulatory Visit: Payer: Self-pay

## 2022-07-18 ENCOUNTER — Other Ambulatory Visit: Payer: Self-pay

## 2022-08-11 ENCOUNTER — Encounter: Payer: 59 | Admitting: Internal Medicine

## 2022-08-15 ENCOUNTER — Other Ambulatory Visit: Payer: Self-pay

## 2022-09-12 ENCOUNTER — Other Ambulatory Visit: Payer: Self-pay

## 2022-09-13 ENCOUNTER — Other Ambulatory Visit: Payer: Self-pay

## 2022-09-14 ENCOUNTER — Other Ambulatory Visit: Payer: Self-pay

## 2022-09-15 ENCOUNTER — Other Ambulatory Visit: Payer: Self-pay

## 2022-09-18 ENCOUNTER — Other Ambulatory Visit: Payer: Self-pay

## 2022-09-18 ENCOUNTER — Encounter: Payer: Self-pay | Admitting: *Deleted

## 2022-09-19 ENCOUNTER — Other Ambulatory Visit: Payer: Self-pay

## 2022-10-03 ENCOUNTER — Other Ambulatory Visit: Payer: Self-pay

## 2022-10-16 ENCOUNTER — Other Ambulatory Visit: Payer: Self-pay

## 2022-11-15 ENCOUNTER — Other Ambulatory Visit: Payer: Self-pay

## 2023-01-26 ENCOUNTER — Other Ambulatory Visit: Payer: Self-pay

## 2023-01-26 ENCOUNTER — Other Ambulatory Visit: Payer: Self-pay | Admitting: Physician Assistant

## 2023-01-26 MED ORDER — EMGALITY 120 MG/ML ~~LOC~~ SOAJ
SUBCUTANEOUS | 11 refills | Status: DC
  Filled 2023-01-26: qty 2, 30d supply, fill #0
  Filled 2023-01-30: qty 1, 30d supply, fill #0
  Filled 2023-02-27: qty 1, 30d supply, fill #1
  Filled 2023-03-31 – 2023-04-02 (×2): qty 1, 28d supply, fill #2
  Filled 2023-04-18: qty 1, 30d supply, fill #2
  Filled 2023-05-15: qty 1, 30d supply, fill #3
  Filled 2023-06-18: qty 1, 30d supply, fill #4
  Filled 2023-07-14: qty 1, 30d supply, fill #5
  Filled 2023-08-13: qty 1, 30d supply, fill #6
  Filled 2023-09-12: qty 1, 30d supply, fill #7

## 2023-01-30 ENCOUNTER — Other Ambulatory Visit: Payer: Self-pay

## 2023-01-31 ENCOUNTER — Other Ambulatory Visit: Payer: Self-pay

## 2023-02-02 ENCOUNTER — Other Ambulatory Visit: Payer: Self-pay

## 2023-02-27 ENCOUNTER — Other Ambulatory Visit: Payer: Self-pay

## 2023-02-27 ENCOUNTER — Other Ambulatory Visit: Payer: Self-pay | Admitting: Internal Medicine

## 2023-02-27 ENCOUNTER — Other Ambulatory Visit: Payer: Self-pay | Admitting: Physician Assistant

## 2023-02-27 MED FILL — Venlafaxine HCl Cap ER 24HR 75 MG (Base Equivalent): ORAL | 90 days supply | Qty: 90 | Fill #0 | Status: AC

## 2023-03-01 ENCOUNTER — Other Ambulatory Visit: Payer: Self-pay

## 2023-03-23 ENCOUNTER — Encounter: Payer: Commercial Managed Care - PPO | Admitting: Physician Assistant

## 2023-03-28 NOTE — Progress Notes (Unsigned)
PCP: Reubin Milan, MD   No chief complaint on file.   HPI:      Ms. Megan Hernandez is a 52 y.o. [phone] whose LMP was No LMP recorded. (Menstrual status: IUD)., presents today for her NP> 3 yrs annual examination.  Her menses are {norm/abn:715}, lasting {number: 22536} days.  Dysmenorrhea {dysmen:716}. She {does:18564} have intermenstrual bleeding. She {does:18564} have vasomotor sx.   Sex activity: {sex active: 315163}. She {does:18564} have vaginal dryness.  Last Pap: 08/05/18  Results were: no abnormalities /neg HPV DNA.  Hx of STDs: {STD hx:14358}  Last mammogram: {date:304500300}  Results were: {norm/abn:13465} There is no FH of breast cancer. There is no FH of ovarian cancer. The patient {does:18564} do self-breast exams.  Colonoscopy: {hx:15363}  Repeat due after 10*** years.   Tobacco use: {tob:20664} Alcohol use: {Alcohol:11675} No drug use Exercise: {exercise:31265}  She {does:18564} get adequate calcium and Vitamin D in her diet.  Labs with PCP.   Patient Active Problem List   Diagnosis Date Noted   Mild hyperlipidemia 03/12/2022   SVT (supraventricular tachycardia) 03/10/2022   Migraine without aura and without status migrainosus, not intractable 11/11/2018   Generalized anxiety disorder 11/11/2018   Muscle spasm 11/11/2018    Past Surgical History:  Procedure Laterality Date   INCISION AND DRAINAGE PERIRECTAL ABSCESS Right 04/15/2019   Procedure: IRRIGATION AND DEBRIDEMENT PERIRECTAL ABSCESS;  Surgeon: Leafy Ro, MD;  Location: [facility] ORS;  Service: General;  Laterality: Right;   KNEE SURGERY  as a teen   As a teen    Family History  Problem Relation Age of Onset   Osteoporosis Mother    Diabetes Father        Type 2   Hypertension Father    Depression Father    Chronic bronchitis Father        copd   Bipolar disorder Father    Heart attack Father     Social History   Socioeconomic History   Marital status: Divorced    Spouse  name: Not on file   Number of children: 4   Years of education: Not on file   Highest education level: Not on file  Occupational History   Not on file  Tobacco Use   Smoking status: Former    Current packs/day: 0.00    Average packs/day: 0.5 packs/day for 7.0 years (3.5 ttl pk-yrs)    Types: Cigarettes    Start date: 2011    Quit date: 2018    Years since quitting: 6.6   Smokeless tobacco: Never  Vaping Use   Vaping status: Some Days   Substances: Nicotine, Flavoring  Substance and Sexual Activity   Alcohol use: Yes    Comment: occ   Drug use: No   Sexual activity: Not Currently    Birth control/protection: I.U.D.    Comment: Mirena  Other Topics Concern   Not on file  Social History Narrative   Not on file   Social Determinants of Health   Financial Resource Strain: Not on file  Food Insecurity: Not on file  Transportation Needs: Not on file  Physical Activity: Not on file  Stress: Not on file  Social Connections: Unknown (12/27/2021)   Received from [facility]   Social Network    Social Network: Not on file  Intimate Partner Violence: Unknown (11/18/2021)   Received from [facility]   HITS    Physically Hurt: Not on file    Insult or Talk Down To:  Not on file    Threaten Physical Harm: Not on file    Scream or Curse: Not on file     Current Outpatient Medications:    benzonatate (TESSALON) 100 MG capsule, Take 1 capsule (100 mg total) by mouth 3 (three) times daily as needed., Disp: 30 capsule, Rfl: 0   Galcanezumab-gnlm (EMGALITY) 120 MG/ML SOAJ, Inject 2 pens (240 mg) into the skin once for the first month, THEN inject 1 pen (120 mg) once a month., Disp: 1 mL, Rfl: 11   ipratropium (ATROVENT) 0.03 % nasal spray, Place 2 sprays into both nostrils 3 (three) times daily as needed for rhinitis., Disp: 30 mL, Rfl: 0   levonorgestrel (MIRENA, 52 MG,) 20 MCG/24HR IUD, 1 Intra Uterine Device (1 each total) by Intrauterine route once., Disp: 1 Intra Uterine Device,  Rfl: 0   lidocaine (XYLOCAINE) 2 % solution, Use as directed 15 mLs in the mouth or throat as needed for mouth pain., Disp: 100 mL, Rfl: 0   predniSONE (DELTASONE) 10 MG tablet, Use 4 tabs on day 1, Use 3 tabs day 2, Use 2 tabs on day 3 and Use 1 tab on day 4., Disp: 10 tablet, Rfl: 0   SUMAtriptan (IMITREX) 100 MG tablet, Take 1 tablet (100 mg total) by mouth once as needed for up to 1 dose for migraine. May repeat in 2 hours if headache persists or recurs., Disp: 9 tablet, Rfl: 11   venlafaxine XR (EFFEXOR-XR) 75 MG 24 hr capsule, Take 1 capsule (75 mg total) by mouth daily with breakfast., Disp: 90 capsule, Rfl: 0     ROS:  Review of Systems BREAST: No symptoms    Objective: There were no vitals taken for this visit.   OBGyn Exam  Results: No results found for this or any previous visit (from the past 24 hour(s)).  Assessment/Plan:  No diagnosis found.   No orders of the defined types were placed in this encounter.           GYN counsel {counseling: 16159}    F/U  No follow-ups on file.  Gianella Chismar B. Cartina Brousseau, PA-C 03/28/2023 7:29 PM

## 2023-03-29 ENCOUNTER — Ambulatory Visit (INDEPENDENT_AMBULATORY_CARE_PROVIDER_SITE_OTHER): Payer: Commercial Managed Care - PPO | Admitting: Obstetrics and Gynecology

## 2023-03-29 ENCOUNTER — Other Ambulatory Visit (HOSPITAL_COMMUNITY)
Admission: RE | Admit: 2023-03-29 | Discharge: 2023-03-29 | Disposition: A | Discharge status: Home / Self Care | Payer: Commercial Managed Care - PPO | Source: Ambulatory Visit | Attending: Obstetrics and Gynecology | Admitting: Obstetrics and Gynecology

## 2023-03-29 ENCOUNTER — Encounter: Payer: Self-pay | Admitting: Obstetrics and Gynecology

## 2023-03-29 VITALS — BP 104/80 | Ht 65.0 in | Wt 161.0 lb

## 2023-03-29 DIAGNOSIS — Z124 Encounter for screening for malignant neoplasm of cervix: Secondary | ICD-10-CM | POA: Diagnosis not present

## 2023-03-29 DIAGNOSIS — Z1151 Encounter for screening for human papillomavirus (HPV): Secondary | ICD-10-CM | POA: Insufficient documentation

## 2023-03-29 DIAGNOSIS — Z1231 Encounter for screening mammogram for malignant neoplasm of breast: Secondary | ICD-10-CM

## 2023-03-29 DIAGNOSIS — Z130 Encounter for screening for diseases of the blood and blood-forming organs and certain disorders involving the immune mechanism: Secondary | ICD-10-CM

## 2023-03-29 DIAGNOSIS — Z1211 Encounter for screening for malignant neoplasm of colon: Secondary | ICD-10-CM

## 2023-03-29 DIAGNOSIS — Z30431 Encounter for routine checking of intrauterine contraceptive device: Secondary | ICD-10-CM

## 2023-03-29 DIAGNOSIS — Z0000 Encounter for general adult medical examination without abnormal findings: Secondary | ICD-10-CM | POA: Diagnosis not present

## 2023-03-29 DIAGNOSIS — Z01419 Encounter for gynecological examination (general) (routine) without abnormal findings: Secondary | ICD-10-CM | POA: Diagnosis not present

## 2023-03-29 DIAGNOSIS — Z13220 Encounter for screening for lipoid disorders: Secondary | ICD-10-CM | POA: Diagnosis not present

## 2023-03-29 NOTE — Patient Instructions (Signed)
I value your feedback and you entrusting us with your care. If you get a [facility] patient survey, I would appreciate you taking the time to let us know about your experience today. Thank you!  [facility] ([place])--[phone]  

## 2023-03-30 LAB — LIPID PANEL
Chol/HDL Ratio: 3 ratio (ref 0.0–4.4)
Cholesterol, Total: 219 mg/dL — ABNORMAL HIGH (ref 100–199)
HDL: 72 mg/dL (ref >39)
LDL Chol Calc (NIH): 135 mg/dL — ABNORMAL HIGH (ref 0–99)
Triglycerides: 69 mg/dL (ref 0–149)
VLDL Cholesterol Cal: 12 mg/dL (ref 5–40)

## 2023-03-30 LAB — COMPREHENSIVE METABOLIC PANEL
ALT: 10 IU/L (ref 0–32)
AST: 17 IU/L (ref 0–40)
Albumin: 4.3 g/dL (ref 3.8–4.9)
Alkaline Phosphatase: 23 IU/L — ABNORMAL LOW (ref 44–121)
BUN/Creatinine Ratio: 16 (ref 9–23)
BUN: 14 mg/dL (ref 6–24)
Bilirubin Total: 0.5 mg/dL (ref 0.0–1.2)
CO2: 25 mmol/L (ref 20–29)
Calcium: 8.9 mg/dL (ref 8.7–10.2)
Chloride: 103 mmol/L (ref 96–106)
Creatinine, Ser: 0.9 mg/dL (ref 0.57–1.00)
Globulin, Total: 2.8 g/dL (ref 1.5–4.5)
Glucose: 94 mg/dL (ref 70–99)
Potassium: 4.3 mmol/L (ref 3.5–5.2)
Sodium: 140 mmol/L (ref 134–144)
Total Protein: 7.1 g/dL (ref 6.0–8.5)
eGFR: 77 mL/min/{1.73_m2} (ref >59)

## 2023-03-30 LAB — CBC WITH DIFFERENTIAL/PLATELET
Basophils Absolute: 0 10*3/uL (ref 0.0–0.2)
Basos: 0 %
EOS (ABSOLUTE): 0.1 10*3/uL (ref 0.0–0.4)
Eos: 2 %
Hematocrit: 38.7 % (ref 34.0–46.6)
Hemoglobin: 13.4 g/dL (ref 11.1–15.9)
Immature Grans (Abs): 0 10*3/uL (ref 0.0–0.1)
Immature Granulocytes: 0 %
Lymphocytes Absolute: 1.7 10*3/uL (ref 0.7–3.1)
Lymphs: 34 %
MCH: 30.8 pg (ref 26.6–33.0)
MCHC: 34.6 g/dL (ref 31.5–35.7)
MCV: 89 fL (ref 79–97)
Monocytes Absolute: 0.5 10*3/uL (ref 0.1–0.9)
Monocytes: 9 %
Neutrophils Absolute: 2.8 10*3/uL (ref 1.4–7.0)
Neutrophils: 55 %
Platelets: 334 10*3/uL (ref 150–450)
RBC: 4.35 x10E6/uL (ref 3.77–5.28)
RDW: 12.6 % (ref 11.7–15.4)
WBC: 5.2 10*3/uL (ref 3.4–10.8)

## 2023-04-02 ENCOUNTER — Other Ambulatory Visit: Payer: Self-pay

## 2023-04-02 LAB — CYTOLOGY - PAP
Comment: NEGATIVE
Diagnosis: NEGATIVE
Diagnosis: REACTIVE
High risk HPV: NEGATIVE

## 2023-04-03 ENCOUNTER — Other Ambulatory Visit: Payer: Self-pay

## 2023-04-19 ENCOUNTER — Other Ambulatory Visit: Payer: Self-pay

## 2023-04-19 DIAGNOSIS — Z1211 Encounter for screening for malignant neoplasm of colon: Secondary | ICD-10-CM | POA: Diagnosis not present

## 2023-04-27 ENCOUNTER — Other Ambulatory Visit: Payer: Self-pay

## 2023-04-27 LAB — COLOGUARD: COLOGUARD: NEGATIVE

## 2023-05-01 ENCOUNTER — Other Ambulatory Visit: Payer: Self-pay

## 2023-05-04 ENCOUNTER — Other Ambulatory Visit: Payer: Self-pay

## 2023-05-04 MED ORDER — CHLORHEXIDINE GLUCONATE 0.12 % MT SOLN
15.0000 mL | Freq: Two times a day (BID) | OROMUCOSAL | 0 refills | Status: DC
  Filled 2023-05-04: qty 473, 16d supply, fill #0

## 2023-05-15 ENCOUNTER — Other Ambulatory Visit: Payer: Self-pay

## 2023-05-18 ENCOUNTER — Other Ambulatory Visit: Payer: Self-pay

## 2023-06-11 ENCOUNTER — Other Ambulatory Visit: Payer: Self-pay

## 2023-06-11 ENCOUNTER — Telehealth: Payer: Commercial Managed Care - PPO | Admitting: Family Medicine

## 2023-06-11 DIAGNOSIS — R3989 Other symptoms and signs involving the genitourinary system: Secondary | ICD-10-CM | POA: Diagnosis not present

## 2023-06-11 MED ORDER — CEPHALEXIN 500 MG PO CAPS
500.0000 mg | ORAL_CAPSULE | Freq: Two times a day (BID) | ORAL | 0 refills | Status: AC
Start: 2023-06-11 — End: 2023-06-18
  Filled 2023-06-11: qty 14, 7d supply, fill #0

## 2023-06-11 NOTE — Progress Notes (Signed)

## 2023-06-13 ENCOUNTER — Ambulatory Visit: Payer: Commercial Managed Care - PPO

## 2023-06-13 ENCOUNTER — Other Ambulatory Visit: Payer: Self-pay

## 2023-06-13 ENCOUNTER — Other Ambulatory Visit: Payer: Self-pay | Admitting: Obstetrics

## 2023-06-13 ENCOUNTER — Encounter: Payer: Self-pay | Admitting: Internal Medicine

## 2023-06-13 ENCOUNTER — Ambulatory Visit: Payer: Commercial Managed Care - PPO | Admitting: Internal Medicine

## 2023-06-13 ENCOUNTER — Encounter: Payer: Self-pay | Admitting: Obstetrics and Gynecology

## 2023-06-13 VITALS — BP 97/67 | HR 72 | Ht 65.0 in | Wt 155.0 lb

## 2023-06-13 DIAGNOSIS — R399 Unspecified symptoms and signs involving the genitourinary system: Secondary | ICD-10-CM

## 2023-06-13 DIAGNOSIS — R112 Nausea with vomiting, unspecified: Secondary | ICD-10-CM

## 2023-06-13 LAB — POCT URINALYSIS DIPSTICK
Bilirubin, UA: NEGATIVE
Blood, UA: NEGATIVE
Glucose, UA: NEGATIVE
Ketones, UA: NEGATIVE
Leukocytes, UA: NEGATIVE
Nitrite, UA: NEGATIVE
Protein, UA: NEGATIVE
Spec Grav, UA: 1.025 (ref 1.010–1.025)
Urobilinogen, UA: 0.2 U/dL
pH, UA: 5 (ref 5.0–8.0)

## 2023-06-13 MED ORDER — ONDANSETRON 4 MG PO TBDP
4.0000 mg | ORAL_TABLET | Freq: Four times a day (QID) | ORAL | 0 refills | Status: DC | PRN
  Filled 2023-06-13: qty 20, 5d supply, fill #0

## 2023-06-13 NOTE — Addendum Note (Signed)
Addended by: Donnetta Hail on: 06/13/2023 11:17 AM   Modules accepted: Orders

## 2023-06-13 NOTE — Progress Notes (Signed)
    NURSE VISIT NOTE  Subjective:    Patient ID: Megan Hernandez, female    DOB: 1971/01/25, 52 y.o.   MRN: [id number]       HPI  Patient is a 52 y.o. [id number] female who presents for a urine drop off. She had an E-Visit on Monday 06/11/23 for UTI symptoms: urinary burning, frequency/urgency. She was prescribed cephalexin (KEFLEX) 500 mg capsule. She took two doses on Monday and one yesterday. She hasn't taken any more due to her stomach hurting/burning feeling and feels nauseous. She is asking if a different antibiotic can be called in. She is still having burning, frequency/urgency urinating. Patient denies blood in urine or when she wipes, also denies pelvic or back pain.  Patient does not have a history of recurrent UTI.  Patient does not have a history of pyelonephritis.   Objective:    BP 97/67   Pulse 72   Ht 5\' 5"  (1.651 m)   Wt 155 lb (70.3 kg)   BMI 25.79 kg/m    Lab Review  Results for orders placed or performed in visit on 06/13/23  POCT Urinalysis Dipstick  Result Value Ref Range   Color, UA     Clarity, UA     Glucose, UA Negative Negative   Bilirubin, UA neg    Ketones, UA neg    Spec Grav, UA 1.025 1.010 - 1.025   Blood, UA neg    pH, UA 5.0 5.0 - 8.0   Protein, UA Negative Negative   Urobilinogen, UA 0.2 0.2 or 1.0 E.U./dL   Nitrite, UA neg    Leukocytes, UA Negative Negative   Appearance     Odor       Assessment:   1. UTI symptoms      Plan:   Urine Culture Sent. Maintain adequate hydration.  May use AZO OTC prn.  Follow up if symptoms worsen or fail to improve as anticipated, and as needed.  Continue current antibiotic. Zofran prescribed for nausea.     Donnetta Hail, CMA

## 2023-06-13 NOTE — Patient Instructions (Signed)
Urinary Tract Infection, Adult A urinary tract infection (UTI) is an infection of any part of the urinary tract. The urinary tract includes: The kidneys. The ureters. The bladder. The urethra. These organs make, store, and get rid of pee (urine) in the body. What are the causes? This infection is caused by germs (bacteria) in your genital area. These germs grow and cause swelling (inflammation) of your urinary tract. What increases the risk? The following factors may make you more likely to develop this condition: Using a small, thin tube (catheter) to drain pee. Not being able to control when you pee or poop (incontinence). Being female. If you are female, these things can increase the risk: Using these methods to prevent pregnancy: A medicine that kills sperm (spermicide). A device that blocks sperm (diaphragm). Having low levels of a female hormone (estrogen). Being pregnant. You are more likely to develop this condition if: You have genes that add to your risk. You are sexually active. You take antibiotic medicines. You have trouble peeing because of: A prostate that is bigger than normal, if you are female. A blockage in the part of your body that drains pee from the bladder. A kidney stone. A nerve condition that affects your bladder. Not getting enough to drink. Not peeing often enough. You have other conditions, such as: Diabetes. A weak disease-fighting system (immune system). Sickle cell disease. Gout. Injury of the spine. What are the signs or symptoms? Symptoms of this condition include: Needing to pee right away. Peeing small amounts often. Pain or burning when peeing. Blood in the pee. Pee that smells bad or not like normal. Trouble peeing. Pee that is cloudy. Fluid coming from the vagina, if you are female. Pain in the belly or lower back. Other symptoms include: Vomiting. Not feeling hungry. Feeling mixed up (confused). This may be the first symptom in  older adults. Being tired and grouchy (irritable). A fever. Watery poop (diarrhea). How is this treated? Taking antibiotic medicine. Taking other medicines. Drinking enough water. In some cases, you may need to see a specialist. Follow these instructions at home:  Medicines Take over-the-counter and prescription medicines only as told by your doctor. If you were prescribed an antibiotic medicine, take it as told by your doctor. Do not stop taking it even if you start to feel better. General instructions Make sure you: Pee until your bladder is empty. Do not hold pee for a long time. Empty your bladder after sex. Wipe from front to back after peeing or pooping if you are a female. Use each tissue one time when you wipe. Drink enough fluid to keep your pee pale yellow. Keep all follow-up visits. Contact a doctor if: You do not get better after 1-2 days. Your symptoms go away and then come back. Get help right away if: You have very bad back pain. You have very bad pain in your lower belly. You have a fever. You have chills. You feeling like you will vomit or you vomit. Summary A urinary tract infection (UTI) is an infection of any part of the urinary tract. This condition is caused by germs in your genital area. There are many risk factors for a UTI. Treatment includes antibiotic medicines. Drink enough fluid to keep your pee pale yellow. This information is not intended to replace advice given to you by your health care provider. Make sure you discuss any questions you have with your health care provider. Document Revised: 03/07/2020 Document Reviewed: 03/12/2020 Elsevier Patient Education    2024 [employer]  

## 2023-06-15 LAB — URINE CULTURE

## 2023-06-18 ENCOUNTER — Other Ambulatory Visit: Payer: Self-pay

## 2023-06-19 ENCOUNTER — Other Ambulatory Visit: Payer: Self-pay

## 2023-07-14 ENCOUNTER — Other Ambulatory Visit: Payer: Self-pay | Admitting: Internal Medicine

## 2023-07-14 ENCOUNTER — Other Ambulatory Visit: Payer: Self-pay

## 2023-07-15 ENCOUNTER — Other Ambulatory Visit: Payer: Self-pay

## 2023-07-15 ENCOUNTER — Other Ambulatory Visit: Payer: Self-pay | Admitting: Internal Medicine

## 2023-07-16 ENCOUNTER — Other Ambulatory Visit: Payer: Self-pay

## 2023-07-17 ENCOUNTER — Other Ambulatory Visit: Payer: Self-pay

## 2023-07-17 NOTE — Telephone Encounter (Signed)
Patient called, left VM to return the call to the office to scheduled an appt for medication refill request.    Requested Prescriptions  Pending Prescriptions Disp Refills   venlafaxine XR (EFFEXOR-XR) 75 MG 24 hr capsule 90 capsule 0    Sig: Take 1 capsule (75 mg total) by mouth daily with breakfast.     Psychiatry: Antidepressants - SNRI - desvenlafaxine & venlafaxine Failed - 07/15/2023  8:18 PM      Failed - Valid encounter within last 6 months    Recent Outpatient Visits           1 year ago SVT (supraventricular tachycardia) (HCC)   [facility], Nyoka Cowden, MD              Failed - Lipid Panel in normal range within the last 12 months    Cholesterol, Total  Date Value Ref Range Status  03/29/2023 219 (H) 100 - 199 mg/dL Final   LDL Chol Calc (NIH)  Date Value Ref Range Status  03/29/2023 135 (H) 0 - 99 mg/dL Final   HDL  Date Value Ref Range Status  03/29/2023 72 >39 mg/dL Final   Triglycerides  Date Value Ref Range Status  03/29/2023 69 0 - 149 mg/dL Final         Passed - Cr in normal range and within 360 days    Creatinine, Ser  Date Value Ref Range Status  03/29/2023 0.90 0.57 - 1.00 mg/dL Final         Passed - Last BP in normal range    BP Readings from Last 1 Encounters:  06/13/23 97/67

## 2023-08-09 ENCOUNTER — Other Ambulatory Visit: Payer: Self-pay | Admitting: *Deleted

## 2023-08-09 ENCOUNTER — Other Ambulatory Visit: Payer: Self-pay

## 2023-08-09 MED ORDER — VENLAFAXINE HCL ER 75 MG PO CP24
75.0000 mg | ORAL_CAPSULE | Freq: Every day | ORAL | 0 refills | Status: DC
  Filled 2023-08-09: qty 90, 90d supply, fill #0

## 2023-08-13 ENCOUNTER — Other Ambulatory Visit: Payer: Self-pay

## 2023-08-22 ENCOUNTER — Ambulatory Visit
Admission: RE | Admit: 2023-08-22 | Discharge: 2023-08-22 | Disposition: A | Discharge status: Home / Self Care | Payer: No Typology Code available for payment source | Source: Ambulatory Visit | Attending: Physician Assistant | Admitting: Physician Assistant

## 2023-08-22 ENCOUNTER — Other Ambulatory Visit: Payer: Self-pay | Admitting: Physician Assistant

## 2023-08-22 DIAGNOSIS — S6701XA Crushing injury of right thumb, initial encounter: Secondary | ICD-10-CM

## 2023-09-12 ENCOUNTER — Other Ambulatory Visit: Payer: Self-pay

## 2023-09-13 ENCOUNTER — Encounter: Payer: Self-pay | Admitting: *Deleted

## 2023-09-18 ENCOUNTER — Other Ambulatory Visit: Payer: Self-pay

## 2023-10-12 ENCOUNTER — Other Ambulatory Visit: Payer: Self-pay

## 2023-10-12 ENCOUNTER — Ambulatory Visit: Payer: Commercial Managed Care - PPO | Admitting: Physician Assistant

## 2023-10-12 ENCOUNTER — Encounter: Payer: Self-pay | Admitting: Physician Assistant

## 2023-10-12 VITALS — BP 94/64 | HR 82 | Wt 153.0 lb

## 2023-10-12 DIAGNOSIS — G43009 Migraine without aura, not intractable, without status migrainosus: Secondary | ICD-10-CM

## 2023-10-12 MED ORDER — VENLAFAXINE HCL ER 75 MG PO CP24
75.0000 mg | ORAL_CAPSULE | Freq: Every day | ORAL | 3 refills | Status: AC
  Filled 2023-10-12: qty 90, 90d supply, fill #0
  Filled 2023-12-11 – 2023-12-16 (×2): qty 90, 90d supply, fill #1
  Filled 2023-12-17: qty 30, 30d supply, fill #1
  Filled 2024-01-25: qty 30, 30d supply, fill #2
  Filled 2024-02-24: qty 30, 30d supply, fill #3
  Filled 2024-03-24: qty 30, 30d supply, fill #4
  Filled 2024-04-25: qty 30, 30d supply, fill #5
  Filled 2024-05-29: qty 30, 30d supply, fill #6
  Filled 2024-06-21 – 2024-06-24 (×2): qty 30, 30d supply, fill #7
  Filled 2024-07-25: qty 30, 30d supply, fill #8
  Filled 2024-08-27: qty 30, 30d supply, fill #9
  Filled 2024-09-12 – 2024-09-16 (×2): qty 30, 30d supply, fill #0

## 2023-10-12 MED ORDER — EMGALITY 120 MG/ML ~~LOC~~ SOAJ
SUBCUTANEOUS | 11 refills | Status: AC
  Filled 2023-10-12: qty 2, 28d supply, fill #0
  Filled 2023-12-12 – 2023-12-16 (×2): qty 2, 30d supply, fill #1
  Filled 2023-12-30: qty 1, 30d supply, fill #1
  Filled 2024-01-25: qty 1, 30d supply, fill #2
  Filled 2024-02-24: qty 1, 30d supply, fill #3
  Filled 2024-03-24: qty 1, 30d supply, fill #4
  Filled 2024-04-25: qty 1, 30d supply, fill #5
  Filled 2024-05-29: qty 1, 30d supply, fill #6
  Filled 2024-06-21 – 2024-06-24 (×2): qty 1, 30d supply, fill #7
  Filled 2024-07-25: qty 1, 30d supply, fill #8
  Filled 2024-08-27: qty 1, 30d supply, fill #9
  Filled 2024-09-12 – 2024-09-16 (×4): qty 1, 30d supply, fill #0

## 2023-10-12 MED ORDER — SUMATRIPTAN SUCCINATE 100 MG PO TABS
100.0000 mg | ORAL_TABLET | Freq: Once | ORAL | 11 refills | Status: AC | PRN
  Filled 2023-10-12: qty 9, 30d supply, fill #0

## 2023-10-12 NOTE — Progress Notes (Signed)
 Pt here to F/U on HA's notes much improvement.   History:  Megan Hernandez is a 53 y.o. [phone] who presents to clinic today for annual headache eval.  She has been using emgality and really feels like that has changed her headache life.  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  Her adult son was in a skiing accident resulting in a concussion and clavicle.  She has lost weight in an attempt to deal with elevation in cholesterol.  She is eating well.  She notes only needing sumatriptan about once every 3 months.  Number of days in the last 4 weeks with:  Severe headache: 1 Moderate headache: 2 Mild headache: 3  No headache: 22   Past Medical History:  Diagnosis Date   Anxiety    Depression    Herpes 01/2015   type 2 on cx   History of mammogram 11/12/213   birads 2 - benign   History of Papanicolaou smear of cervix 07/14/2013   -/-   Menstrual migraine    SVT (supraventricular tachycardia) (HCC)    Vaginal lesion 02/01/2015    Social History   Socioeconomic History   Marital status: Divorced    Spouse name: Not on file   Number of children: 4   Years of education: Not on file   Highest education level: Not on file  Occupational History   Not on file  Tobacco Use   Smoking status: Former    Current packs/day: 0.00    Average packs/day: 0.5 packs/day for 7.0 years (3.5 ttl pk-yrs)    Types: Cigarettes    Start date: 2011    Quit date: 2018    Years since quitting: 7.1   Smokeless tobacco: Never  Vaping Use   Vaping status: Every Day   Substances: Nicotine, Flavoring  Substance and Sexual Activity   Alcohol use: Yes    Comment: occ   Drug use: No   Sexual activity: Not Currently    Birth control/protection: I.U.D.    Comment: Mirena  Other Topics Concern   Not on file  Social History Narrative   Not on file   Social Drivers of Health   Financial  Resource Strain: Not on file  Food Insecurity: Not on file  Transportation Needs: Not on file  Physical Activity: Not on file  Stress: Not on file  Social Connections: Unknown (12/27/2021)   Received from [facility]   Social Network    Social Network: Not on file  Intimate Partner Violence: Unknown (11/18/2021)   Received from [facility], [facility]   HITS    Physically Hurt: Not on file    Insult or Talk Down To: Not on file    Threaten Physical Harm: Not on file    Scream or Curse: Not on file    Family History  Problem Relation Age of Onset   Osteoporosis Mother    Diabetes Father        Type 2   Hypertension Father    Depression Father    Chronic bronchitis Father        copd   Bipolar disorder Father    Heart attack Father     No Known Allergies  Current Outpatient Medications on File Prior to Visit  Medication Sig Dispense Refill   chlorhexidine (PERIDEX) 0.12 % solution Rinse  with 15 mLs by Mouth Rinse route 2 (two) times daily morning and evening after brushing. Spit out excess after rinsing. 473 mL 0   Galcanezumab-gnlm (EMGALITY) 120 MG/ML SOAJ Inject 2 pens (240 mg) into the skin once for the first month, THEN inject 1 pen (120 mg) once a month. 1 mL 11   levonorgestrel (MIRENA, 52 MG,) 20 MCG/24HR IUD 1 Intra Uterine Device (1 each total) by Intrauterine route once. 1 Intra Uterine Device 0   ondansetron (ZOFRAN-ODT) 4 MG disintegrating tablet Take 1 tablet (4 mg total) by mouth every 6 (six) hours as needed for nausea. 20 tablet 0   SUMAtriptan (IMITREX) 100 MG tablet Take 1 tablet (100 mg total) by mouth once as needed for up to 1 dose for migraine. May repeat in 2 hours if headache persists or recurs. 9 tablet 11   venlafaxine XR (EFFEXOR-XR) 75 MG 24 hr capsule Take 1 capsule (75 mg total) by mouth daily with breakfast. 90 capsule 0   No current facility-administered medications on file prior to visit.     Review of Systems:  All  pertinent positive/negative included in HPI, all other review of systems are negative   Objective:  Physical Exam BP 94/64   Pulse 82   Wt 153 lb (69.4 kg)   BMI 25.46 kg/m  CONSTITUTIONAL: Well-developed, well-nourished female in no acute distress.  EYES: EOM intact ENT: Normocephalic CARDIOVASCULAR: Regular rate  RESPIRATORY: Normal rate.  MUSCULOSKELETAL: Normal ROM SKIN: Warm, dry without erythema  NEUROLOGICAL: Alert, oriented, CN II-XII grossly intact, Appropriate balance PSYCH: Normal behavior, mood   Assessment & Plan:  Assessment: 1. Migraine without aura and without status migrainosus, not intractable      Plan: Continue emgality and effexor for prevention of migraine Continue sumatriptan for acute migraine Continue healthy diet and lifestyle Follow-up in 12 months or sooner PRN  34 minutes spent in direct care with this patient this encounter. Bertram Denver, PA-C 10/12/2023 11:10 AM

## 2023-10-22 ENCOUNTER — Other Ambulatory Visit: Payer: Self-pay

## 2023-11-12 ENCOUNTER — Other Ambulatory Visit: Payer: Self-pay

## 2023-11-12 ENCOUNTER — Telehealth: Admitting: Physician Assistant

## 2023-11-12 DIAGNOSIS — K120 Recurrent oral aphthae: Secondary | ICD-10-CM | POA: Diagnosis not present

## 2023-11-12 MED ORDER — TRIAMCINOLONE ACETONIDE 0.1 % MT PSTE
1.0000 | PASTE | Freq: Three times a day (TID) | OROMUCOSAL | 0 refills | Status: AC
  Filled 2023-11-12: qty 5, 2d supply, fill #0

## 2023-11-12 NOTE — Progress Notes (Signed)

## 2023-12-12 ENCOUNTER — Other Ambulatory Visit: Payer: Self-pay

## 2023-12-14 ENCOUNTER — Other Ambulatory Visit: Payer: Self-pay

## 2023-12-16 ENCOUNTER — Other Ambulatory Visit: Payer: Self-pay

## 2023-12-17 ENCOUNTER — Other Ambulatory Visit: Payer: Self-pay

## 2023-12-30 ENCOUNTER — Other Ambulatory Visit: Payer: Self-pay

## 2024-01-25 ENCOUNTER — Other Ambulatory Visit: Payer: Self-pay

## 2024-02-24 ENCOUNTER — Other Ambulatory Visit: Payer: Self-pay

## 2024-02-26 ENCOUNTER — Other Ambulatory Visit: Payer: Self-pay

## 2024-03-24 ENCOUNTER — Other Ambulatory Visit: Payer: Self-pay

## 2024-04-02 NOTE — Progress Notes (Unsigned)
 PCP: Justus Leita DEL, MD   No chief complaint on file.   HPI:      Megan Hernandez is a 53 y.o. [phone] who LMP was No LMP recorded (lmp unknown). (Menstrual status: IUD)., presents today for her annual examination.  Her menses are absent with IUD, no BTB, no dysmen. Tolerable vasomotor sx. Had poor cycle control in past with OCPs.   Sex activity: single partner, contraception - IUD. Mirena  placed 01/11/17. Last Pap: 03/29/23  Results were: no abnormalities /neg HPV DNA  Hx of STDs: HSV, with outbreak maybe once yearly now, no meds needed; trich 1/20   Last mammogram: not recent; pt uninterested in past due to radiation exposure.  There is no FH of breast cancer. There is no FH of ovarian cancer. The patient does do self-breast exams.   Tobacco use: The patient denies current or previous tobacco use. Alcohol use: social drinker No drug use.  Exercise: moderately active  Colonoscopy: never; NEG Cologuard 9/24, repeat due after 3 yrs   She does get adequate calcium but not Vitamin D in her diet.   Borderline lipids 9/24; Needs this yr for health forms.     Patient Active Problem List   Diagnosis Date Noted   Mild hyperlipidemia 03/12/2022   SVT (supraventricular tachycardia) (HCC) 03/10/2022   Migraine without aura and without status migrainosus, not intractable 11/11/2018   Generalized anxiety disorder 11/11/2018   Muscle spasm 11/11/2018    Past Surgical History:  Procedure Laterality Date   INCISION AND DRAINAGE PERIRECTAL ABSCESS Right 04/15/2019   Procedure: IRRIGATION AND DEBRIDEMENT PERIRECTAL ABSCESS;  Surgeon: Jordis Laneta FALCON, MD;  Location: [facility] ORS;  Service: General;  Laterality: Right;   KNEE SURGERY  as a teen   As a teen    Family History  Problem Relation Age of Onset   Osteoporosis Mother    Diabetes Father        Type 2   Hypertension Father    Depression Father    Chronic bronchitis Father        copd   Bipolar disorder Father    Heart  attack Father     Social History   Socioeconomic History   Marital status: Divorced    Spouse name: Not on file   Number of children: 4   Years of education: Not on file   Highest education level: Not on file  Occupational History   Not on file  Tobacco Use   Smoking status: Former    Current packs/day: 0.00    Average packs/day: 0.5 packs/day for 7.0 years (3.5 ttl pk-yrs)    Types: Cigarettes    Start date: 2011    Quit date: 2018    Years since quitting: 7.6   Smokeless tobacco: Never  Vaping Use   Vaping status: Every Day   Substances: Nicotine, Flavoring  Substance and Sexual Activity   Alcohol use: Yes    Comment: occ   Drug use: No   Sexual activity: Not Currently    Birth control/protection: I.U.D.    Comment: Mirena   Other Topics Concern   Not on file  Social History Narrative   Not on file   Social Drivers of Health   Financial Resource Strain: Not on file  Food Insecurity: Not on file  Transportation Needs: Not on file  Physical Activity: Not on file  Stress: Not on file  Social Connections: Unknown (12/27/2021)   Received from [facility]   Social Network  Social Network: Not on file  Intimate Partner Violence: Unknown (11/18/2021)   Received from [facility]   HITS    Physically Hurt: Not on file    Insult or Talk Down To: Not on file    Threaten Physical Harm: Not on file    Scream or Curse: Not on file     Current Outpatient Medications:    chlorhexidine  (PERIDEX ) 0.12 % solution, Rinse with 15 mLs by Mouth Rinse route 2 (two) times daily morning and evening after brushing. Spit out excess after rinsing., Disp: 473 mL, Rfl: 0   Galcanezumab -gnlm (EMGALITY ) 120 MG/ML SOAJ, Inject 2 pens (240 mg) into the skin once for the first month, THEN inject 1 pen (120 mg) once a month., Disp: 1 mL, Rfl: 11   levonorgestrel  (MIRENA , 52 MG,) 20 MCG/24HR IUD, 1 Intra Uterine Device (1 each total) by Intrauterine route once., Disp: 1 Intra Uterine  Device, Rfl: 0   SUMAtriptan  (IMITREX ) 100 MG tablet, Take 1 tablet (100 mg total) by mouth once as needed for up to 1 dose for migraine. May repeat in 2 hours if headache persists or recurs., Disp: 9 tablet, Rfl: 11   venlafaxine  XR (EFFEXOR -XR) 75 MG 24 hr capsule, Take 1 capsule (75 mg total) by mouth daily with breakfast., Disp: 90 capsule, Rfl: 3     ROS:  Review of Systems  Constitutional:  Negative for fatigue, fever and unexpected weight change.  Respiratory:  Negative for cough, shortness of breath and wheezing.   Cardiovascular:  Negative for chest pain, palpitations and leg swelling.  Gastrointestinal:  Negative for blood in stool, constipation, diarrhea, nausea and vomiting.  Endocrine: Negative for cold intolerance, heat intolerance and polyuria.  Genitourinary:  Negative for dyspareunia, dysuria, flank pain, frequency, genital sores, hematuria, menstrual problem, pelvic pain, urgency, vaginal bleeding, vaginal discharge and vaginal pain.  Musculoskeletal:  Negative for back pain, joint swelling and myalgias.  Skin:  Negative for rash.  Neurological:  Negative for dizziness, syncope, light-headedness, numbness and headaches.  Hematological:  Negative for adenopathy.  Psychiatric/Behavioral:  Negative for agitation, confusion, sleep disturbance and suicidal ideas. The patient is not nervous/anxious.    BREAST: No symptoms    Objective: There were no vitals taken for this visit.   Physical Exam Constitutional:      Appearance: She is well-developed.  Genitourinary:     Vulva normal.     Right Labia: No rash, tenderness or lesions.    Left Labia: No tenderness, lesions or rash.    No vaginal discharge, erythema or tenderness.      Right Adnexa: not tender and no mass present.    Left Adnexa: not tender and no mass present.    No cervical friability or polyp.     IUD strings visualized.     Uterus is not enlarged or tender.  Breasts:    Right: No mass, nipple  discharge, skin change or tenderness.     Left: No mass, nipple discharge, skin change or tenderness.  Neck:     Thyroid: No thyromegaly.  Cardiovascular:     Rate and Rhythm: Normal rate and regular rhythm.     Heart sounds: Normal heart sounds. No murmur heard. Pulmonary:     Effort: Pulmonary effort is normal.     Breath sounds: Normal breath sounds.  Abdominal:     Palpations: Abdomen is soft.     Tenderness: There is no abdominal tenderness. There is no guarding or rebound.  Musculoskeletal:  General: Normal range of motion.     Cervical back: Normal range of motion.  Lymphadenopathy:     Cervical: No cervical adenopathy.  Neurological:     General: No focal deficit present.     Mental Status: She is alert and oriented to person, place, and time.     Cranial Nerves: No cranial nerve deficit.  Skin:    General: Skin is warm and dry.  Psychiatric:        Mood and Affect: Mood normal.        Behavior: Behavior normal.        Thought Content: Thought content normal.        Judgment: Judgment normal.  Vitals reviewed.     Assessment/Plan:  Encounter for annual routine gynecological examination  Cervical cancer screening - Plan: Cytology - PAP  Screening for HPV (human papillomavirus) - Plan: Cytology - PAP  Encounter for routine checking of intrauterine contraceptive device (IUD)--IUD strings in cx os; has 8 yr indication. Will wait to remove at 8 yrs so pt should be postmenopausal or close to it at that time.   Encounter for screening mammogram for malignant neoplasm of breast - Plan: MM 3D SCREENING MAMMOGRAM BILATERAL BREAST; pt to schedule mammo  Screening for colon cancer - Plan: Cologuard; colonoscopy/cologuard discussed. Pt elects cologuard. Ref sent. Will f/u with results.  Blood tests for routine general physical examination - Plan: Comprehensive metabolic panel, Lipid panel, CBC with Differential/Platelet  Screening cholesterol level - Plan: Lipid  panel  Screening for deficiency anemia - Plan: CBC with Differential/Platelet          GYN counsel breast self exam, mammography screening, menopause, adequate intake of calcium and vitamin D, diet and exercise    F/U  No follow-ups on file.  Kahleel Fadeley B. Ledarius Leeson, PA-C 04/02/2024 4:59 PM

## 2024-04-03 ENCOUNTER — Ambulatory Visit (INDEPENDENT_AMBULATORY_CARE_PROVIDER_SITE_OTHER): Admitting: Obstetrics and Gynecology

## 2024-04-03 ENCOUNTER — Encounter: Payer: Self-pay | Admitting: Obstetrics and Gynecology

## 2024-04-03 VITALS — BP 102/68 | HR 83 | Ht 65.0 in | Wt 154.0 lb

## 2024-04-03 DIAGNOSIS — Z0000 Encounter for general adult medical examination without abnormal findings: Secondary | ICD-10-CM

## 2024-04-03 DIAGNOSIS — Z13220 Encounter for screening for lipoid disorders: Secondary | ICD-10-CM | POA: Diagnosis not present

## 2024-04-03 DIAGNOSIS — Z01419 Encounter for gynecological examination (general) (routine) without abnormal findings: Secondary | ICD-10-CM | POA: Diagnosis not present

## 2024-04-03 DIAGNOSIS — Z30431 Encounter for routine checking of intrauterine contraceptive device: Secondary | ICD-10-CM

## 2024-04-03 DIAGNOSIS — Z1211 Encounter for screening for malignant neoplasm of colon: Secondary | ICD-10-CM

## 2024-04-03 DIAGNOSIS — Z1231 Encounter for screening mammogram for malignant neoplasm of breast: Secondary | ICD-10-CM

## 2024-04-03 NOTE — Patient Instructions (Signed)
 I value your feedback and you entrusting [place] with your care. If you get a [place] patient survey, I would appreciate you taking the time to let us know about your experience today. Thank you! ? ? ?

## 2024-04-04 LAB — CBC WITH DIFFERENTIAL/PLATELET
Basophils Absolute: 0 x10E3/uL (ref 0.0–0.2)
Basos: 1 %
EOS (ABSOLUTE): 0.1 x10E3/uL (ref 0.0–0.4)
Eos: 2 %
Hematocrit: 41.6 % (ref 34.0–46.6)
Hemoglobin: 13.6 g/dL (ref 11.1–15.9)
Immature Grans (Abs): 0 x10E3/uL (ref 0.0–0.1)
Immature Granulocytes: 0 %
Lymphocytes Absolute: 1.6 x10E3/uL (ref 0.7–3.1)
Lymphs: 33 %
MCH: 30.7 pg (ref 26.6–33.0)
MCHC: 32.7 g/dL (ref 31.5–35.7)
MCV: 94 fL (ref 79–97)
Monocytes Absolute: 0.4 x10E3/uL (ref 0.1–0.9)
Monocytes: 9 %
Neutrophils Absolute: 2.7 x10E3/uL (ref 1.4–7.0)
Neutrophils: 55 %
Platelets: 297 x10E3/uL (ref 150–450)
RBC: 4.43 x10E6/uL (ref 3.77–5.28)
RDW: 12.6 % (ref 11.7–15.4)
WBC: 4.8 x10E3/uL (ref 3.4–10.8)

## 2024-04-04 LAB — COMPREHENSIVE METABOLIC PANEL WITH GFR
ALT: 11 IU/L (ref 0–32)
AST: 18 IU/L (ref 0–40)
Albumin: 4.3 g/dL (ref 3.8–4.9)
Alkaline Phosphatase: 25 IU/L — ABNORMAL LOW (ref 44–121)
BUN/Creatinine Ratio: 13 (ref 9–23)
BUN: 12 mg/dL (ref 6–24)
Bilirubin Total: 0.5 mg/dL (ref 0.0–1.2)
CO2: 23 mmol/L (ref 20–29)
Calcium: 8.9 mg/dL (ref 8.7–10.2)
Chloride: 103 mmol/L (ref 96–106)
Creatinine, Ser: 0.9 mg/dL (ref 0.57–1.00)
Globulin, Total: 2.6 g/dL (ref 1.5–4.5)
Glucose: 92 mg/dL (ref 70–99)
Potassium: 4.4 mmol/L (ref 3.5–5.2)
Sodium: 139 mmol/L (ref 134–144)
Total Protein: 6.9 g/dL (ref 6.0–8.5)
eGFR: 76 mL/min/1.73 (ref >59)

## 2024-04-04 LAB — LIPID PANEL
Chol/HDL Ratio: 3 ratio (ref 0.0–4.4)
Cholesterol, Total: 187 mg/dL (ref 100–199)
HDL: 63 mg/dL (ref >39)
LDL Chol Calc (NIH): 111 mg/dL — ABNORMAL HIGH (ref 0–99)
Triglycerides: 71 mg/dL (ref 0–149)
VLDL Cholesterol Cal: 13 mg/dL (ref 5–40)

## 2024-04-06 ENCOUNTER — Ambulatory Visit: Payer: Self-pay | Admitting: Obstetrics and Gynecology

## 2024-04-25 ENCOUNTER — Other Ambulatory Visit: Payer: Self-pay

## 2024-05-21 ENCOUNTER — Telehealth: Admitting: Physician Assistant

## 2024-05-21 ENCOUNTER — Other Ambulatory Visit: Payer: Self-pay

## 2024-05-21 DIAGNOSIS — B9789 Other viral agents as the cause of diseases classified elsewhere: Secondary | ICD-10-CM

## 2024-05-21 DIAGNOSIS — J019 Acute sinusitis, unspecified: Secondary | ICD-10-CM | POA: Diagnosis not present

## 2024-05-21 MED ORDER — FLUTICASONE PROPIONATE 50 MCG/ACT NA SUSP
2.0000 | Freq: Every day | NASAL | 0 refills | Status: AC
  Filled 2024-05-21: qty 16, 30d supply, fill #0

## 2024-05-21 NOTE — Progress Notes (Signed)
 We are sorry that you are not feeling well.  Here is how we plan to help!  Based on what you have shared with me it looks like you have sinusitis.  Sinusitis is inflammation and infection in the sinus cavities of the head.  Based on your presentation I believe you most likely have Acute Viral Sinusitis.This is an infection most likely caused by a virus. There is not specific treatment for viral sinusitis other than to help you with the symptoms until the infection runs its course.  You may use an oral decongestant such as Mucinex D or if you have glaucoma or high blood pressure use plain Mucinex. Saline nasal spray help and can safely be used as often as needed for congestion, I have prescribed: Fluticasone nasal spray two sprays in each nostril once a day  Some authorities believe that zinc sprays or the use of Echinacea may shorten the course of your symptoms.  Sinus infections are not as easily transmitted as other respiratory infection, however we still recommend that you avoid close contact with loved ones, especially the very young and elderly.  Remember to wash your hands thoroughly throughout the day as this is the number one way to prevent the spread of infection!  Home Care: Only take medications as instructed by your medical team. Do not take these medications with alcohol. A steam or ultrasonic humidifier can help congestion.  You can place a towel over your head and breathe in the steam from hot water coming from a faucet. Avoid close contacts especially the very young and the elderly. Cover your mouth when you cough or sneeze. Always remember to wash your hands.  Get Help Right Away If: You develop worsening fever or sinus pain. You develop a severe head ache or visual changes. Your symptoms persist after you have completed your treatment plan.  Make sure you Understand these instructions. Will watch your condition. Will get help right away if you are not doing well or get  worse.  Your e-visit answers were reviewed by a board certified advanced clinical practitioner to complete your personal care plan.  Depending on the condition, your plan could have included both over the counter or prescription medications.  If there is a problem please reply  once you have received a response from your provider.  Your safety is important to us .  If you have drug allergies check your prescription carefully.    You can use MyChart to ask questions about today's visit, request a non-urgent call back, or ask for a work or school excuse for 24 hours related to this e-Visit. If it has been greater than 24 hours you will need to follow up with your provider, or enter a new e-Visit to address those concerns.  You will get an e-mail in the next two days asking about your experience.  I hope that your e-visit has been valuable and will speed your recovery. Thank you for using e-visits.  I have spent 5 minutes in review of e-visit questionnaire, review and updating patient chart, medical decision making and response to patient.   Delon CHRISTELLA Dickinson, PA-C

## 2024-05-29 ENCOUNTER — Other Ambulatory Visit: Payer: Self-pay

## 2024-06-21 ENCOUNTER — Other Ambulatory Visit: Payer: Self-pay

## 2024-06-24 ENCOUNTER — Other Ambulatory Visit: Payer: Self-pay

## 2024-07-25 ENCOUNTER — Other Ambulatory Visit: Payer: Self-pay

## 2024-08-20 ENCOUNTER — Other Ambulatory Visit: Payer: Self-pay

## 2024-08-27 ENCOUNTER — Encounter: Payer: Self-pay | Admitting: Pharmacist

## 2024-08-27 ENCOUNTER — Other Ambulatory Visit: Payer: Self-pay

## 2024-08-27 ENCOUNTER — Other Ambulatory Visit (HOSPITAL_COMMUNITY): Payer: Self-pay

## 2024-09-01 ENCOUNTER — Other Ambulatory Visit: Payer: Self-pay

## 2024-09-12 ENCOUNTER — Other Ambulatory Visit: Payer: Self-pay

## 2024-09-12 ENCOUNTER — Telehealth (HOSPITAL_COMMUNITY): Payer: Self-pay

## 2024-09-12 ENCOUNTER — Other Ambulatory Visit (HOSPITAL_COMMUNITY): Payer: Self-pay

## 2024-09-12 NOTE — Telephone Encounter (Signed)
 PA request has been Received. New Encounter has been or will be created for follow up. For additional info see Pharmacy Prior Auth telephone encounter from 09/12/24.

## 2024-09-12 NOTE — Telephone Encounter (Signed)
 Pharmacy Patient Advocate Encounter  Received notification from [facility] that Prior Authorization for Emgality  120MG /ML auto-injectors  has been APPROVED from 09/12/24 to 09/11/25. Ran test claim, Copay is $35. This test claim was processed through [facility]- copay amounts may vary at other pharmacies due to pharmacy/plan contracts, or as the patient moves through the different stages of their insurance plan.   PA #/Case ID/Reference #: [id number]

## 2024-09-13 ENCOUNTER — Encounter: Payer: Self-pay | Admitting: Physician Assistant

## 2024-09-15 ENCOUNTER — Encounter: Payer: Self-pay | Admitting: Pharmacist

## 2024-09-15 ENCOUNTER — Other Ambulatory Visit: Payer: Self-pay

## 2024-09-16 ENCOUNTER — Other Ambulatory Visit: Payer: Self-pay

## 2024-09-17 ENCOUNTER — Other Ambulatory Visit: Payer: Self-pay
